# Patient Record
Sex: Female | Born: 1965 | Race: White | Hispanic: No | Smoking: Never smoker
Health system: Southern US, Community
[De-identification: ages and names within clinical notes are randomized; demographics above are authoritative.]

## PROBLEM LIST (undated history)

## (undated) DIAGNOSIS — I10 Essential (primary) hypertension: Secondary | ICD-10-CM

## (undated) DIAGNOSIS — Z8719 Personal history of other diseases of the digestive system: Secondary | ICD-10-CM

## (undated) DIAGNOSIS — T8859XA Other complications of anesthesia, initial encounter: Secondary | ICD-10-CM

## (undated) DIAGNOSIS — E041 Nontoxic single thyroid nodule: Secondary | ICD-10-CM

## (undated) DIAGNOSIS — E119 Type 2 diabetes mellitus without complications: Secondary | ICD-10-CM

## (undated) DIAGNOSIS — J45909 Unspecified asthma, uncomplicated: Secondary | ICD-10-CM

## (undated) DIAGNOSIS — I4891 Unspecified atrial fibrillation: Secondary | ICD-10-CM

## (undated) DIAGNOSIS — J189 Pneumonia, unspecified organism: Secondary | ICD-10-CM

## (undated) DIAGNOSIS — Z87442 Personal history of urinary calculi: Secondary | ICD-10-CM

## (undated) DIAGNOSIS — F419 Anxiety disorder, unspecified: Secondary | ICD-10-CM

## (undated) DIAGNOSIS — T4145XA Adverse effect of unspecified anesthetic, initial encounter: Secondary | ICD-10-CM

## (undated) DIAGNOSIS — K219 Gastro-esophageal reflux disease without esophagitis: Secondary | ICD-10-CM

## (undated) DIAGNOSIS — E7800 Pure hypercholesterolemia, unspecified: Secondary | ICD-10-CM

## (undated) HISTORY — DX: Nontoxic single thyroid nodule: E04.1

## (undated) HISTORY — DX: Gastro-esophageal reflux disease without esophagitis: K21.9

## (undated) HISTORY — DX: Pneumonia, unspecified organism: J18.9

## (undated) HISTORY — DX: Anxiety disorder, unspecified: F41.9

## (undated) HISTORY — PX: OTHER SURGICAL HISTORY: SHX169

## (undated) HISTORY — DX: Pure hypercholesterolemia, unspecified: E78.00

## (undated) HISTORY — DX: Type 2 diabetes mellitus without complications: E11.9

## (undated) HISTORY — DX: Essential (primary) hypertension: I10

## (undated) HISTORY — DX: Unspecified atrial fibrillation: I48.91

---

## 2004-01-05 ENCOUNTER — Other Ambulatory Visit: Admission: RE | Admit: 2004-01-05 | Discharge: 2004-01-05 | Payer: Self-pay | Admitting: Gynecology

## 2004-11-07 ENCOUNTER — Ambulatory Visit (HOSPITAL_COMMUNITY): Admission: RE | Admit: 2004-11-07 | Discharge: 2004-11-07 | Payer: Self-pay | Admitting: Gynecology

## 2005-01-06 ENCOUNTER — Other Ambulatory Visit: Admission: RE | Admit: 2005-01-06 | Discharge: 2005-01-06 | Payer: Self-pay | Admitting: Gynecology

## 2005-11-09 ENCOUNTER — Ambulatory Visit (HOSPITAL_COMMUNITY): Admission: RE | Admit: 2005-11-09 | Discharge: 2005-11-09 | Payer: Self-pay | Admitting: Gynecology

## 2006-01-08 ENCOUNTER — Other Ambulatory Visit: Admission: RE | Admit: 2006-01-08 | Discharge: 2006-01-08 | Payer: Self-pay | Admitting: Gynecology

## 2006-09-18 HISTORY — PX: OTHER SURGICAL HISTORY: SHX169

## 2006-11-12 ENCOUNTER — Ambulatory Visit (HOSPITAL_COMMUNITY): Admission: RE | Admit: 2006-11-12 | Discharge: 2006-11-12 | Payer: Self-pay | Admitting: Internal Medicine

## 2007-01-11 ENCOUNTER — Other Ambulatory Visit: Admission: RE | Admit: 2007-01-11 | Discharge: 2007-01-11 | Payer: Self-pay | Admitting: Gynecology

## 2007-03-02 ENCOUNTER — Inpatient Hospital Stay (HOSPITAL_COMMUNITY): Admission: EM | Admit: 2007-03-02 | Discharge: 2007-03-06 | Payer: Self-pay | Admitting: Emergency Medicine

## 2007-11-14 ENCOUNTER — Ambulatory Visit (HOSPITAL_COMMUNITY): Admission: RE | Admit: 2007-11-14 | Discharge: 2007-11-14 | Payer: Self-pay | Admitting: Gynecology

## 2008-01-14 ENCOUNTER — Other Ambulatory Visit: Admission: RE | Admit: 2008-01-14 | Discharge: 2008-01-14 | Payer: Self-pay | Admitting: Gynecology

## 2008-08-20 ENCOUNTER — Ambulatory Visit: Payer: Self-pay | Admitting: Gynecology

## 2008-11-17 ENCOUNTER — Ambulatory Visit (HOSPITAL_COMMUNITY): Admission: RE | Admit: 2008-11-17 | Discharge: 2008-11-17 | Payer: Self-pay | Admitting: Gynecology

## 2009-01-15 ENCOUNTER — Other Ambulatory Visit: Admission: RE | Admit: 2009-01-15 | Discharge: 2009-01-15 | Payer: Self-pay | Admitting: Gynecology

## 2009-01-15 ENCOUNTER — Ambulatory Visit: Payer: Self-pay | Admitting: Gynecology

## 2009-01-15 ENCOUNTER — Encounter: Payer: Self-pay | Admitting: Gynecology

## 2009-01-18 ENCOUNTER — Ambulatory Visit: Payer: Self-pay | Admitting: Gynecology

## 2009-11-10 ENCOUNTER — Encounter: Admission: RE | Admit: 2009-11-10 | Discharge: 2009-11-10 | Payer: Self-pay | Admitting: Family Medicine

## 2009-11-15 ENCOUNTER — Ambulatory Visit (HOSPITAL_COMMUNITY): Admission: RE | Admit: 2009-11-15 | Discharge: 2009-11-15 | Payer: Self-pay | Admitting: Urology

## 2009-11-24 ENCOUNTER — Ambulatory Visit (HOSPITAL_COMMUNITY): Admission: RE | Admit: 2009-11-24 | Discharge: 2009-11-24 | Payer: Self-pay | Admitting: Gynecology

## 2010-01-17 ENCOUNTER — Ambulatory Visit: Payer: Self-pay | Admitting: Gynecology

## 2010-01-17 ENCOUNTER — Other Ambulatory Visit: Admission: RE | Admit: 2010-01-17 | Discharge: 2010-01-17 | Payer: Self-pay | Admitting: Gynecology

## 2010-10-09 ENCOUNTER — Encounter: Payer: Self-pay | Admitting: Family Medicine

## 2010-12-09 ENCOUNTER — Other Ambulatory Visit: Payer: Self-pay | Admitting: Gynecology

## 2010-12-09 DIAGNOSIS — Z1231 Encounter for screening mammogram for malignant neoplasm of breast: Secondary | ICD-10-CM

## 2010-12-09 LAB — PREGNANCY, URINE: Preg Test, Ur: NEGATIVE

## 2010-12-09 LAB — GLUCOSE, CAPILLARY: Glucose-Capillary: 88 mg/dL (ref 70–99)

## 2010-12-21 ENCOUNTER — Ambulatory Visit (HOSPITAL_COMMUNITY)
Admission: RE | Admit: 2010-12-21 | Discharge: 2010-12-21 | Disposition: A | Discharge status: Home / Self Care | Payer: BC Managed Care – PPO | Source: Ambulatory Visit | Attending: Gynecology | Admitting: Gynecology

## 2010-12-21 DIAGNOSIS — Z1231 Encounter for screening mammogram for malignant neoplasm of breast: Secondary | ICD-10-CM | POA: Insufficient documentation

## 2011-01-31 NOTE — Op Note (Signed)
NAMESARAIYA, KOZMA                  ACCOUNT NO.:  [id number]   MEDICAL RECORD NO.:  [id number]          PATIENT TYPE:  INP   LOCATION:  5024                         FACILITY:  [facility]   PHYSICIAN:  Alvy Beal, MD    DATE OF BIRTH:  03/07/1966   DATE OF PROCEDURE:  DATE OF DISCHARGE:                               OPERATIVE REPORT   PREOPERATIVE DIAGNOSIS:  Right comminuted ankle fracture.   POSTOPERATIVE DIAGNOSIS:  Right comminuted ankle fracture.   PROCEDURE PERFORMED:  Open reduction and internal fixation, right ankle.   EQUIPMENT UTILIZED:  Instrumentation used was a Synthes small fragment  set, 8-hole one-third semitubular plate affixed to the fibula proximally  with three 12 mm screws, and distally with two 16 mm screw and one 18 mm  screw.   COMPLICATIONS:  None.   INTRAOPERATIVE FINDINGS:  Comminuted fracture with posterior disruption.  Able to place a single lag screw from anterior to posterior with a  decent purchase.  Anatomical reduction obtained and maintained with the  plate.  Syndesmosis stressed post plating of the fibula, and there was  no widening of the medial clear space.   HISTORY:  Kristina is a very pleasant 45 year old woman who was in her usual  state of excellent health until she fell and injured her right ankle on  Saturday.  She was admitted after a diagnosis of a complex ankle  fracture and dislocation was made.  She was closed reduced, placed into  a splint.  Today she presents to the operating room for definitive  management.   All appropriate risks, benefits and alternatives to surgery were  explained to the patient and consent was obtained.   DESCRIPTION OF PROCEDURE:  The patient was brought to the operating room  and placed supine on the operating table.  After successful induction of  general anesthesia and endotracheal intubation, a tourniquet was placed  on the right proximal thigh.  The calf and ankle and foot were prepped  and draped in  the standard fashion.  The leg was then exsanguinated with  a 6-inch Esmarch and the tourniquet inflated.  (Total tourniquet time  was 67 minutes.)   The tourniquet was properly inflated and we then made a lateral incision  over the fibula.  Sharp dissection was carried out through the  subcutaneous tissue and out to the level of the fibula.  I then  identified the fracture site and, using a #15 blade, removed the  periosteum that had become embedded into the fracture site.  Minimal  stripping was done in order to prevent compromise to the blood supply to  the fragments.  On the inferior surface there was a free butterfly  fragment noted to be present.  On the superior surface there was also a  very small free fragment.  However, there was enough that I could get a  fracture clamp around the fibula and reduce it.  Once reduced, I took an  AP, lateral and mortise views.  These showed it to be satisfactory.  The  ankle was out to length and the  medial clear space was well maintained.   At this point in time I then placed a single lag screw from anterior to  posterior.  This did obtain adequate fixation.  With the lag screw in  place, I then was able to remove the fracture clamp and had satisfactory  temporary fixation.  I then took an 8-hole one-third semitubular plate,  affixed it to the side of the fibula, and then secured it proximally and  distally with three 12 mm screws proximally; and then distally with one  16 mm bicortical screw, one 16 cancellous unicortical screw, and one 18  mm unicortical cancellous screw.  All screws had excellent purchase.  I  then irrigated the wound copiously with normal saline, and then took  final AP, lateral and mortise views.  There was satisfactory reduction  of the mortise, with no evidence of any widening.   I then took an elevator, placed it between the distal fibula and tibia,  and stressed the joint.  There was no widening noted of the medial  clear  space when this was done under fluoroscopy.  I also then took a bone  clamp, grabbed the fibula and under fluoroscopy, pulled the fibula to  ensure that there was no disruption of the syndesmosis.  With the  syndesmosis satisfactorily tested and the medial clear space well  maintained, I elected not to put a syndesmotic screw.   At this point in time with satisfactory fixation, I irrigated the wound  copiously with normal saline and closed the wound with interrupted 2-0  Vicryl sutures.  The skin was reapproximated with a running 2-0 nylon  stitch.  A dry dressing was applied, and then a Quincy Simmonds bulky  dressing was applied.  A posterior splint with side struts was then  secured with Webril to the patient.  The ankle was again properly molded  with the ankle in neutral and slight inversion.  Once the plaster  hardened, we then placed an ACE wrap over the entire thing.  The patient  tolerated the procedure well.  She was extubated and transferred to the  PACU without incident.  In the PACU she was moving all of her toes,  capillary refill was less than 2 seconds, and sensation to light touch  was intact.   At the end of the case, all needle and sponge counts were correct.   POSTOPERATIVE PLAN:  At this point in time we will plan on getting final  AP, lateral and mortise views in the morning, and will start PT/OT in  the morning.  She will eventually be discharged to home, to follow up  with me in two weeks, at which time we will remove the splint and  transition her to a short-leg cast.  She should remain with strict  nonweightbearing with crutches or a walker.      Alvy Beal, MD  Electronically Signed     DDB/MEDQ  D:  03/04/2007  T:  03/04/2007  Job:  [id number]

## 2011-01-31 NOTE — Discharge Summary (Signed)
[name], [name]                  ACCOUNT NO.:  [id number]   MEDICAL RECORD NO.:  [id number]          PATIENT TYPE:  INP   LOCATION:  5024                         FACILITY:  [facility]   PHYSICIAN:  Alvy Beal, MD    DATE OF BIRTH:  28-Aug-1966   DATE OF ADMISSION:  03/02/2007  DATE OF DISCHARGE:  03/06/2007                               DISCHARGE SUMMARY   ADMISSION DIAGNOSIS:  Right ankle fracture, dislocation.   DISCHARGE DIAGNOSIS:  Right ankle fracture, dislocation.   OPERATIVE PROCEDURE:  On March 04, 2007, was an open reduction internal  fixation of the right ankle.   HISTORY:  [name] is a very pleasant 45 year old woman who was  walking, slipped and fell and injured her right ankle.  She presented to  the emergency room with significant pain and deformity of the ankle.  X-  rays demonstrated a fracture, dislocation of the right ankle.  I was  asked to see her since I was on call.   A hematoma block was given and I reduced the ankle.  She still had  residual unstable distal fibula fracture, as well as a posterior  malleolus fracture.  After discussing options, we elected to admit the  patient for pain control and elevation.  The patient remained stable and  neurovascularly intact.  On Monday, June 16, a decision was made to take  the patient to the operating room for definitive management of the  fracture.  All appropriate risks, benefits and alternatives to surgery  were explained to the patient and consent was obtained.  She went to the  operating room that afternoon for an open reduction internal fixation.  The patient tolerated the procedure well and then was eventually  transferred back to the orthopedic floor.   Postoperative x-rays demonstrated satisfactory reduction of the ankle  fracture.   The patient was then evaluated by physiotherapy and then eventually  cleared for home discharge.  She is now ambulating with a walker,  nonweightbearing on the right lower  extremity.   She is tolerating p.o. diet and p.o. pain medications.   At this point in time, she will be discharged to home, again on strict  nonweightbearing with assistance on that right lower extremity.  She has  been given medications for her pain control, for muscle spasms and for  antiemetics.  She will also recontinue her home medications for her  diabetes.   I will have her followup with me in 2 weeks, at which time the splint  will be removed, the sutures from the wound will be taken out, Steri-  Strips will be applied, x-rays will be repeated and a cast will then be  applied.  She will continue to be strict nonweightbearing for a total of  6 weeks.     Alvy Beal, MD  Electronically Signed    DDB/MEDQ  D:  03/06/2007  T:  03/06/2007  Job:  [id number]

## 2011-01-31 NOTE — H&P (Signed)
Paula Campos, Paula Campos                  ACCOUNT NO.:  [id number]   MEDICAL RECORD NO.:  [id number]          PATIENT TYPE:  EMS   LOCATION:  MAJO                         FACILITY:  [facility]   PHYSICIAN:  Alvy Beal, MD    DATE OF BIRTH:  [birth year]   DATE OF ADMISSION:  03/02/2007  DATE OF DISCHARGE:                              HISTORY & PHYSICAL   PRIMARY CARE PHYSICIAN:  Marjory Lies, M.D.   HISTORY:  Elpidia is a very pleasant, active 45 year old woman with  underlying diabetes, well controlled with oral medication.  She was in  her usual state of good, excellent health until she fell this afternoon  at home.  She just lost her footing and fell.  She denied any loss of  consciousness, shortness of breath or chest pain or blurry vision prior  to her falling the fall.  She noted immediate pain and deformity of the  right ankle and so she was brought to the emergency room.  X-rays in the  emergency room demonstrated a fracture/dislocation of the right ankle,  and so an orthopedic consultation was requested.   PAST MEDICAL/SURGICAL/FAMILY SOCIAL HISTORY:  Diabetes.  She is a  nonsmoker.  She does not drink.  She is otherwise healthy, except for  the diabetes, which is well controlled with oral diabetic medications.   ALLERGIES:  HER ONLY DRUG ALLERGY IS AMOXICILLIN.   CURRENT MEDICATIONS:  1. Actos.  2. Altace.  3. Aspirin.  4. Singulair.   RADIOGRAPHIC DATA:  X-rays do demonstrate a fracture/dislocation with  posterior subluxation of the talus with respect to the tibia and a  proximal fibula fracture.   CLINICAL EXAM:  GENERAL:  The patient is a pleasant woman who appears  her stated age and in no acute distress.  She denies any shortness of  breath or chest pain.  ABDOMEN:  Soft, nontender.  EXTREMITIES:  She has no significant knee pain or swelling on that right  side.  She is neurologically intact, moving all of her toes.  Capillary  refill is less than 2 seconds in all  digits and she has intact  peripheral pulses.  Left lower extremity is asymptomatic.  LUNGS:  Lung fields are clear to auscultation.  HEART:  Regular rate and rhythm.   PLAN:  At this point in time, under sterile conditions a 10 mL  intraarticular lidocaine injection was administered.  I then reduced the  ankle and then placed a posterior splint with side strips.  The  reduction went uneventful.  Postreduction films at this time are  pending.  Upon the recommendation of the radiologist, I am going to  order also a CT scan, which will allow for better preoperative planning.  We will go ahead and admit her for pain control with PCA morphine as  well as frequent soft tissue checks.  After the reduction, she remained  neurovascularly intact, moving all of her toes with excellent sensation  and light touch as well as capillary refill less than 2 seconds in all  of her toes.   The plan at this  point in time is to admit for pain control.  We will  discharge her tomorrow after she is cleared by PT and we will plan on  definitive fixation in the upcoming week.  All of this was explained to  the patient.  All of her questions were addressed.      Alvy Beal, MD  Electronically Signed     DDB/MEDQ  D:  03/02/2007  T:  03/02/2007  Job:  [id number]   cc:   Marjory Lies, M.D.

## 2011-07-05 LAB — POCT I-STAT EG7
Calcium, Ion: 1.17
HCT: 36
Hemoglobin: 12.2
Operator id: 138551
pCO2, Ven: 37 — ABNORMAL LOW
pH, Ven: 7.418 — ABNORMAL HIGH
pO2, Ven: 35

## 2011-07-06 LAB — URINALYSIS, ROUTINE W REFLEX MICROSCOPIC
Bilirubin Urine: NEGATIVE
Glucose, UA: NEGATIVE
Hgb urine dipstick: NEGATIVE
Ketones, ur: NEGATIVE
Nitrite: NEGATIVE
Protein, ur: NEGATIVE
Specific Gravity, Urine: 1.022
Urobilinogen, UA: 0.2
pH: 6

## 2011-07-06 LAB — URINE CULTURE
Colony Count: 70000
Special Requests: NEGATIVE

## 2011-07-06 LAB — URINE MICROSCOPIC-ADD ON

## 2011-07-06 LAB — DIFFERENTIAL
Eosinophils Absolute: 0
Lymphs Abs: 0.8
Monocytes Relative: 3
Neutro Abs: 8.8 — ABNORMAL HIGH
Neutrophils Relative %: 89 — ABNORMAL HIGH

## 2011-07-06 LAB — CBC
MCV: 92
Platelets: 287
RBC: 4.3
WBC: 9.9

## 2011-07-06 LAB — APTT: aPTT: 26

## 2011-07-06 LAB — PROTIME-INR
INR: 1
Prothrombin Time: 13.1

## 2012-10-29 ENCOUNTER — Other Ambulatory Visit: Payer: Self-pay | Admitting: Gastroenterology

## 2012-10-29 DIAGNOSIS — R1310 Dysphagia, unspecified: Secondary | ICD-10-CM

## 2012-11-08 ENCOUNTER — Ambulatory Visit
Admission: RE | Admit: 2012-11-08 | Discharge: 2012-11-08 | Disposition: A | Discharge status: Home / Self Care | Payer: BC Managed Care – PPO | Source: Ambulatory Visit | Attending: Gastroenterology | Admitting: Gastroenterology

## 2012-11-08 DIAGNOSIS — R1310 Dysphagia, unspecified: Secondary | ICD-10-CM

## 2013-01-01 ENCOUNTER — Other Ambulatory Visit: Payer: Self-pay | Admitting: Gynecology

## 2013-01-01 DIAGNOSIS — Z1231 Encounter for screening mammogram for malignant neoplasm of breast: Secondary | ICD-10-CM

## 2013-01-08 ENCOUNTER — Ambulatory Visit (HOSPITAL_COMMUNITY)
Admission: RE | Admit: 2013-01-08 | Discharge: 2013-01-08 | Disposition: A | Discharge status: Home / Self Care | Payer: BC Managed Care – PPO | Source: Ambulatory Visit | Attending: Gynecology | Admitting: Gynecology

## 2013-01-08 DIAGNOSIS — Z1231 Encounter for screening mammogram for malignant neoplasm of breast: Secondary | ICD-10-CM | POA: Insufficient documentation

## 2014-03-25 ENCOUNTER — Other Ambulatory Visit: Payer: Self-pay | Admitting: Gynecology

## 2014-03-25 DIAGNOSIS — Z1231 Encounter for screening mammogram for malignant neoplasm of breast: Secondary | ICD-10-CM

## 2014-04-02 ENCOUNTER — Ambulatory Visit (HOSPITAL_COMMUNITY)
Admission: RE | Admit: 2014-04-02 | Discharge: 2014-04-02 | Disposition: A | Discharge status: Home / Self Care | Payer: BC Managed Care – PPO | Source: Ambulatory Visit | Attending: Gynecology | Admitting: Gynecology

## 2014-04-02 DIAGNOSIS — Z1231 Encounter for screening mammogram for malignant neoplasm of breast: Secondary | ICD-10-CM | POA: Insufficient documentation

## 2014-05-11 ENCOUNTER — Ambulatory Visit: Payer: Self-pay | Admitting: Gynecology

## 2014-05-21 ENCOUNTER — Encounter: Payer: Self-pay | Admitting: Gynecology

## 2014-05-21 ENCOUNTER — Other Ambulatory Visit (HOSPITAL_COMMUNITY)
Admission: RE | Admit: 2014-05-21 | Discharge: 2014-05-21 | Disposition: A | Discharge status: Home / Self Care | Payer: BC Managed Care – PPO | Source: Ambulatory Visit | Attending: Gynecology | Admitting: Gynecology

## 2014-05-21 ENCOUNTER — Ambulatory Visit (INDEPENDENT_AMBULATORY_CARE_PROVIDER_SITE_OTHER): Payer: BC Managed Care – PPO | Admitting: Gynecology

## 2014-05-21 VITALS — BP 140/84 | Ht 64.0 in | Wt 371.0 lb

## 2014-05-21 DIAGNOSIS — Z1151 Encounter for screening for human papillomavirus (HPV): Secondary | ICD-10-CM | POA: Diagnosis present

## 2014-05-21 DIAGNOSIS — Z01419 Encounter for gynecological examination (general) (routine) without abnormal findings: Secondary | ICD-10-CM | POA: Insufficient documentation

## 2014-05-21 DIAGNOSIS — I10 Essential (primary) hypertension: Secondary | ICD-10-CM | POA: Insufficient documentation

## 2014-05-21 DIAGNOSIS — E119 Type 2 diabetes mellitus without complications: Secondary | ICD-10-CM | POA: Insufficient documentation

## 2014-05-21 DIAGNOSIS — E7800 Pure hypercholesterolemia, unspecified: Secondary | ICD-10-CM | POA: Insufficient documentation

## 2014-05-21 DIAGNOSIS — K219 Gastro-esophageal reflux disease without esophagitis: Secondary | ICD-10-CM | POA: Insufficient documentation

## 2014-05-21 NOTE — Patient Instructions (Signed)
You may obtain a copy of any labs that were done today by logging onto MyChart as outlined in the instructions provided with your AVS (after visit summary). The office will not call with normal lab results but certainly if there are any significant abnormalities then we will contact you.   Health Maintenance, Female A healthy lifestyle and preventative care can promote health and wellness.  Maintain regular health, dental, and eye exams.  Eat a healthy diet. Foods like vegetables, fruits, whole grains, low-fat dairy products, and lean protein foods contain the nutrients you need without too many calories. Decrease your intake of foods high in solid fats, added sugars, and salt. Get information about a proper diet from your caregiver, if necessary.  Regular physical exercise is one of the most important things you can do for your health. Most adults should get at least 150 minutes of moderate-intensity exercise (any activity that increases your heart rate and causes you to sweat) each week. In addition, most adults need muscle-strengthening exercises on 2 or more days a week.   Maintain a healthy weight. The body mass index (BMI) is a screening tool to identify possible weight problems. It provides an estimate of body fat based on height and weight. Your caregiver can help determine your BMI, and can help you achieve or maintain a healthy weight. For adults 20 years and older:  A BMI below 18.5 is considered underweight.  A BMI of 18.5 to 24.9 is normal.  A BMI of 25 to 29.9 is considered overweight.  A BMI of 30 and above is considered obese.  Maintain normal blood lipids and cholesterol by exercising and minimizing your intake of saturated fat. Eat a balanced diet with plenty of fruits and vegetables. Blood tests for lipids and cholesterol should begin at age 61 and be repeated every 5 years. If your lipid or cholesterol levels are high, you are over 50, or you are a high risk for heart  disease, you may need your cholesterol levels checked more frequently.Ongoing high lipid and cholesterol levels should be treated with medicines if diet and exercise are not effective.  If you smoke, find out from your caregiver how to quit. If you do not use tobacco, do not start.  Lung cancer screening is recommended for adults aged 33 80 years who are at high risk for developing lung cancer because of a history of smoking. Yearly low-dose computed tomography (CT) is recommended for people who have at least a 30-pack-year history of smoking and are a current smoker or have quit within the past 15 years. A pack year of smoking is smoking an average of 1 pack of cigarettes a day for 1 year (for example: 1 pack a day for 30 years or 2 packs a day for 15 years). Yearly screening should continue until the smoker has stopped smoking for at least 15 years. Yearly screening should also be stopped for people who develop a health problem that would prevent them from having lung cancer treatment.  If you are pregnant, do not drink alcohol. If you are breastfeeding, be very cautious about drinking alcohol. If you are not pregnant and choose to drink alcohol, do not exceed 1 drink per day. One drink is considered to be 12 ounces (355 mL) of beer, 5 ounces (148 mL) of wine, or 1.5 ounces (44 mL) of liquor.  Avoid use of street drugs. Do not share needles with anyone. Ask for help if you need support or instructions about stopping  the use of drugs.  High blood pressure causes heart disease and increases the risk of stroke. Blood pressure should be checked at least every 1 to 2 years. Ongoing high blood pressure should be treated with medicines, if weight loss and exercise are not effective.  If you are 59 to 50 years old, ask your caregiver if you should take aspirin to prevent strokes.  Diabetes screening involves taking a blood sample to check your fasting blood sugar level. This should be done once every 3  years, after age 91, if you are within normal weight and without risk factors for diabetes. Testing should be considered at a younger age or be carried out more frequently if you are overweight and have at least 1 risk factor for diabetes.  Breast cancer screening is essential preventative care for women. You should practice "breast self-awareness." This means understanding the normal appearance and feel of your breasts and may include breast self-examination. Any changes detected, no matter how small, should be reported to a caregiver. Women in their 66s and 30s should have a clinical breast exam (CBE) by a caregiver as part of a regular health exam every 1 to 3 years. After age 101, women should have a CBE every year. Starting at age 100, women should consider having a mammogram (breast X-ray) every year. Women who have a family history of breast cancer should talk to their caregiver about genetic screening. Women at a high risk of breast cancer should talk to their caregiver about having an MRI and a mammogram every year.  Breast cancer gene (BRCA)-related cancer risk assessment is recommended for women who have family members with BRCA-related cancers. BRCA-related cancers include breast, ovarian, tubal, and peritoneal cancers. Having family members with these cancers may be associated with an increased risk for harmful changes (mutations) in the breast cancer genes BRCA1 and BRCA2. Results of the assessment will determine the need for genetic counseling and BRCA1 and BRCA2 testing.  The Pap test is a screening test for cervical cancer. Women should have a Pap test starting at age 57. Between ages 25 and 35, Pap tests should be repeated every 2 years. Beginning at age 37, you should have a Pap test every 3 years as long as the past 3 Pap tests have been normal. If you had a hysterectomy for a problem that was not cancer or a condition that could lead to cancer, then you no longer need Pap tests. If you are  between ages 50 and 76, and you have had normal Pap tests going back 10 years, you no longer need Pap tests. If you have had past treatment for cervical cancer or a condition that could lead to cancer, you need Pap tests and screening for cancer for at least 20 years after your treatment. If Pap tests have been discontinued, risk factors (such as a new sexual partner) need to be reassessed to determine if screening should be resumed. Some women have medical problems that increase the chance of getting cervical cancer. In these cases, your caregiver may recommend more frequent screening and Pap tests.  The human papillomavirus (HPV) test is an additional test that may be used for cervical cancer screening. The HPV test looks for the virus that can cause the cell changes on the cervix. The cells collected during the Pap test can be tested for HPV. The HPV test could be used to screen women aged 44 years and older, and should be used in women of any age  who have unclear Pap test results. After the age of 55, women should have HPV testing at the same frequency as a Pap test.  Colorectal cancer can be detected and often prevented. Most routine colorectal cancer screening begins at the age of 44 and continues through age 20. However, your caregiver may recommend screening at an earlier age if you have risk factors for colon cancer. On a yearly basis, your caregiver may provide home test kits to check for hidden blood in the stool. Use of a small camera at the end of a tube, to directly examine the colon (sigmoidoscopy or colonoscopy), can detect the earliest forms of colorectal cancer. Talk to your caregiver about this at age 86, when routine screening begins. Direct examination of the colon should be repeated every 5 to 10 years through age 13, unless early forms of pre-cancerous polyps or small growths are found.  Hepatitis C blood testing is recommended for all people born from [birth year] through 1967 and any  individual with known risks for hepatitis C.  Practice safe sex. Use condoms and avoid high-risk sexual practices to reduce the spread of sexually transmitted infections (STIs). Sexually active women aged 36 and younger should be checked for Chlamydia, which is a common sexually transmitted infection. Older women with new or multiple partners should also be tested for Chlamydia. Testing for other STIs is recommended if you are sexually active and at increased risk.  Osteoporosis is a disease in which the bones lose minerals and strength with aging. This can result in serious bone fractures. The risk of osteoporosis can be identified using a bone density scan. Women ages 20 and over and women at risk for fractures or osteoporosis should discuss screening with their caregivers. Ask your caregiver whether you should be taking a calcium supplement or vitamin D to reduce the rate of osteoporosis.  Menopause can be associated with physical symptoms and risks. Hormone replacement therapy is available to decrease symptoms and risks. You should talk to your caregiver about whether hormone replacement therapy is right for you.  Use sunscreen. Apply sunscreen liberally and repeatedly throughout the day. You should seek shade when your shadow is shorter than you. Protect yourself by wearing long sleeves, pants, a wide-brimmed hat, and sunglasses year round, whenever you are outdoors.  Notify your caregiver of new moles or changes in moles, especially if there is a change in shape or color. Also notify your caregiver if a mole is larger than the size of a pencil eraser.  Stay current with your immunizations. Document Released: 03/20/2011 Document Revised: 12/30/2012 Document Reviewed: 03/20/2011 [facility] Patient Information 2014 [employer].

## 2014-05-21 NOTE — Addendum Note (Signed)
Addended by: Dayna Barker on: 05/21/2014 10:40 AM   Modules accepted: Orders

## 2014-05-21 NOTE — Progress Notes (Signed)
Paula Campos 1966/02/24 [id number]        48 y.o.  G2P0011 for annual exam.  Doing well without complaints. Has not been in since 2011.  Past medical history,surgical history, problem list, medications, allergies, family history and social history were all reviewed and documented as reviewed in the EPIC chart.  ROS:  12 system ROS performed with pertinent positives and negatives included in the history, assessment and plan.   Additional significant findings :  None   Exam: Kim assistant Filed Vitals:   05/21/14 0948  BP: 140/84  Height:  (1.626 m)  Weight: 371 lb (168.284 kg)   General appearance:  Normal affect, orientation and appearance. Skin: Grossly normal HEENT: Without gross lesions.  No cervical or supraclavicular adenopathy. Thyroid normal.  Lungs:  Clear without wheezing, rales or rhonchi Cardiac: RR, without RMG Abdominal:  Soft, nontender, without masses, guarding, rebound, organomegaly or hernia Breasts:  Examined lying and sitting without masses, retractions, discharge or axillary adenopathy. Pelvic:  Ext/BUS/vagina normal  Cervix normal. Pap/HPV  Uterus difficult to palpate but grossly normal without masses or tenderness  Adnexa  Without gross masses or tenderness    Anus and perineum  Normal   Rectovaginal  Normal sphincter tone without palpated masses or tenderness.    Assessment/Plan:  48 y.o. G20P0011 female for annual exam with regular menses, vasectomy birth control.   1. Pap smear 2011. Pap/HPV today. No history of significant abnormal Pap smears previously. Plan repeat Pap smear in 3-5 year interval purpura screening guidelines. 2. Mammography 03/2014. Continue with annual mammography. SBE monthly review. 3. Health maintenance. No routine blood work done as she has this done through her primary physician's office she is following her for her medical issues. Follow up in one year, sooner as needed.   Note: This document was prepared with digital  dictation and possible smart phrase technology. Any transcriptional errors that result from this process are unintentional.   Dara Lords MD, 10:23 AM 05/21/2014

## 2014-05-22 LAB — CYTOLOGY - PAP

## 2014-07-20 ENCOUNTER — Encounter: Payer: Self-pay | Admitting: Gynecology

## 2014-12-01 ENCOUNTER — Other Ambulatory Visit: Payer: Self-pay | Admitting: Gastroenterology

## 2015-02-23 ENCOUNTER — Encounter (HOSPITAL_COMMUNITY): Payer: Self-pay | Admitting: *Deleted

## 2015-04-02 ENCOUNTER — Other Ambulatory Visit: Payer: Self-pay | Admitting: Gynecology

## 2015-04-02 DIAGNOSIS — Z1231 Encounter for screening mammogram for malignant neoplasm of breast: Secondary | ICD-10-CM

## 2015-04-08 ENCOUNTER — Ambulatory Visit (HOSPITAL_COMMUNITY)
Admission: RE | Admit: 2015-04-08 | Discharge: 2015-04-08 | Disposition: A | Discharge status: Home / Self Care | Payer: BLUE CROSS/BLUE SHIELD | Source: Ambulatory Visit | Attending: Gynecology | Admitting: Gynecology

## 2015-04-08 DIAGNOSIS — Z1231 Encounter for screening mammogram for malignant neoplasm of breast: Secondary | ICD-10-CM | POA: Insufficient documentation

## 2015-05-06 ENCOUNTER — Ambulatory Visit (HOSPITAL_COMMUNITY)
Admission: RE | Admit: 2015-05-06 | Payer: BLUE CROSS/BLUE SHIELD | Source: Ambulatory Visit | Admitting: Gastroenterology

## 2015-05-06 HISTORY — DX: Personal history of urinary calculi: Z87.442

## 2015-05-06 HISTORY — DX: Personal history of other diseases of the digestive system: Z87.19

## 2015-05-06 HISTORY — DX: Other complications of anesthesia, initial encounter: T88.59XA

## 2015-05-06 HISTORY — DX: Adverse effect of unspecified anesthetic, initial encounter: T41.45XA

## 2015-05-06 HISTORY — DX: Unspecified asthma, uncomplicated: J45.909

## 2015-05-06 SURGERY — COLONOSCOPY WITH PROPOFOL
Anesthesia: Monitor Anesthesia Care

## 2015-05-25 ENCOUNTER — Encounter: Payer: BC Managed Care – PPO | Admitting: Gynecology

## 2015-05-25 ENCOUNTER — Encounter: Payer: Self-pay | Admitting: Gynecology

## 2015-07-23 ENCOUNTER — Ambulatory Visit (INDEPENDENT_AMBULATORY_CARE_PROVIDER_SITE_OTHER): Payer: BLUE CROSS/BLUE SHIELD | Admitting: Gynecology

## 2015-07-23 ENCOUNTER — Encounter: Payer: Self-pay | Admitting: Gynecology

## 2015-07-23 VITALS — BP 134/80 | Ht 65.0 in | Wt 386.0 lb

## 2015-07-23 DIAGNOSIS — Z01419 Encounter for gynecological examination (general) (routine) without abnormal findings: Secondary | ICD-10-CM

## 2015-07-23 NOTE — Progress Notes (Signed)
Verl BlalockLeah L Gatlin [birth year] [id number]        49 y.o.  G2P0011  Patient's last menstrual period was 07/07/2015. for annual exam.  Doing well without complaints  Past medical history,surgical history, problem list, medications, allergies, family history and social history were all reviewed and documented as reviewed in the EPIC chart.  ROS:  Performed with pertinent positives and negatives included in the history, assessment and plan.   Additional significant findings :  none   Exam: [facility] personassistant Filed Vitals:   07/23/15 1527  BP: 134/80  Height: 5\' 5"  (1.651 m)  Weight: 386 lb (175.088 kg)   General appearance:  Normal affect, orientation and appearance. Skin: Grossly normal HEENT: Without gross lesions.  No cervical or supraclavicular adenopathy. Thyroid normal.  Lungs:  Clear without wheezing, rales or rhonchi Cardiac: RR, without RMG Abdominal:  Soft, nontender, without masses, guarding, rebound, organomegaly or hernia Breasts:  Examined lying and sitting without masses, retractions, discharge or axillary adenopathy. Pelvic:  Ext/BUS/vagina normal  Cervix normal  Uterus unable to palpate due to abdominal girth but no gross masses or tenderness  Adnexa  Without gross masses or tenderness    Anus and perineum  Normal   Rectovaginal  Normal sphincter tone without palpated masses or tenderness.    Assessment/Plan:  49 y.o. 652P0011 female for annual exam with regular menses, vasectomy birth control. No signs or symptoms to suggest menopausal changes.  1. Regular menses. Age 49. Reviewed signs and symptoms of menopause. Patient will call if she has prolonged or atypical bleeding. As long as regular menses or less frequent but regular menses and will follow at present. 2. Pap smear/HPV 2015 negative. No Pap smear done today.  No history of abnormal Pap smears. 3. Mammography 03/2015. Continue with annual mammography when due. SBE monthly reviewed. 4. Colonoscopy never. She had a  schedule but had to cancel. She knows to go ahead and call and  Reschedule and she agrees to do so. 5. Health maintenance. No routine lab work done as this is done through her primary physician's office. Follow up 1 year, sooner as needed.   Dara LordsFONTAINE,Glorianne Proctor P MD, 4:01 PM 07/23/2015

## 2015-07-23 NOTE — Patient Instructions (Signed)

## 2015-11-04 ENCOUNTER — Other Ambulatory Visit: Payer: Self-pay | Admitting: Gastroenterology

## 2015-12-01 ENCOUNTER — Encounter (HOSPITAL_COMMUNITY): Payer: Self-pay | Admitting: *Deleted

## 2015-12-07 ENCOUNTER — Encounter (HOSPITAL_COMMUNITY): Payer: Self-pay

## 2015-12-07 ENCOUNTER — Encounter (HOSPITAL_COMMUNITY)
Admission: RE | Disposition: A | Discharge status: Home / Self Care | Payer: Self-pay | Source: Ambulatory Visit | Attending: Gastroenterology

## 2015-12-07 ENCOUNTER — Ambulatory Visit (HOSPITAL_COMMUNITY): Payer: BLUE CROSS/BLUE SHIELD | Admitting: Certified Registered Nurse Anesthetist

## 2015-12-07 ENCOUNTER — Ambulatory Visit (HOSPITAL_COMMUNITY)
Admission: RE | Admit: 2015-12-07 | Discharge: 2015-12-07 | Disposition: A | Discharge status: Home / Self Care | Payer: BLUE CROSS/BLUE SHIELD | Source: Ambulatory Visit | Attending: Gastroenterology | Admitting: Gastroenterology

## 2015-12-07 DIAGNOSIS — Z6841 Body Mass Index (BMI) 40.0 and over, adult: Secondary | ICD-10-CM | POA: Diagnosis not present

## 2015-12-07 DIAGNOSIS — Z79899 Other long term (current) drug therapy: Secondary | ICD-10-CM | POA: Diagnosis not present

## 2015-12-07 DIAGNOSIS — K449 Diaphragmatic hernia without obstruction or gangrene: Secondary | ICD-10-CM | POA: Insufficient documentation

## 2015-12-07 DIAGNOSIS — E7800 Pure hypercholesterolemia, unspecified: Secondary | ICD-10-CM | POA: Diagnosis not present

## 2015-12-07 DIAGNOSIS — I10 Essential (primary) hypertension: Secondary | ICD-10-CM | POA: Diagnosis not present

## 2015-12-07 DIAGNOSIS — J45909 Unspecified asthma, uncomplicated: Secondary | ICD-10-CM | POA: Diagnosis not present

## 2015-12-07 DIAGNOSIS — E119 Type 2 diabetes mellitus without complications: Secondary | ICD-10-CM | POA: Insufficient documentation

## 2015-12-07 DIAGNOSIS — Z7984 Long term (current) use of oral hypoglycemic drugs: Secondary | ICD-10-CM | POA: Insufficient documentation

## 2015-12-07 DIAGNOSIS — F419 Anxiety disorder, unspecified: Secondary | ICD-10-CM | POA: Insufficient documentation

## 2015-12-07 DIAGNOSIS — K5730 Diverticulosis of large intestine without perforation or abscess without bleeding: Secondary | ICD-10-CM | POA: Insufficient documentation

## 2015-12-07 DIAGNOSIS — Z1211 Encounter for screening for malignant neoplasm of colon: Secondary | ICD-10-CM | POA: Diagnosis present

## 2015-12-07 DIAGNOSIS — K219 Gastro-esophageal reflux disease without esophagitis: Secondary | ICD-10-CM | POA: Diagnosis not present

## 2015-12-07 HISTORY — PX: COLONOSCOPY WITH PROPOFOL: SHX5780

## 2015-12-07 LAB — GLUCOSE, CAPILLARY: GLUCOSE-CAPILLARY: 98 mg/dL (ref 65–99)

## 2015-12-07 SURGERY — COLONOSCOPY WITH PROPOFOL
Anesthesia: Monitor Anesthesia Care

## 2015-12-07 MED ORDER — PROPOFOL 500 MG/50ML IV EMUL
INTRAVENOUS | Status: DC | PRN
  Administered 2015-12-07: 75 ug/kg/min via INTRAVENOUS

## 2015-12-07 MED ORDER — SODIUM CHLORIDE 0.9 % IV SOLN: INTRAVENOUS | Status: DC

## 2015-12-07 MED ORDER — SODIUM CHLORIDE 0.9 % IJ SOLN
INTRAMUSCULAR | Status: AC
  Filled 2015-12-07: qty 10

## 2015-12-07 MED ORDER — LIDOCAINE HCL (CARDIAC) 20 MG/ML IV SOLN
INTRAVENOUS | Status: AC
  Filled 2015-12-07: qty 5

## 2015-12-07 MED ORDER — PROPOFOL 10 MG/ML IV BOLUS
INTRAVENOUS | Status: DC | PRN
  Administered 2015-12-07: 20 mg via INTRAVENOUS
  Administered 2015-12-07: 30 mg via INTRAVENOUS
  Administered 2015-12-07 (×2): 50 mg via INTRAVENOUS

## 2015-12-07 MED ORDER — LIDOCAINE HCL (CARDIAC) 20 MG/ML IV SOLN
INTRAVENOUS | Status: DC | PRN
  Administered 2015-12-07: 50 mg via INTRAVENOUS

## 2015-12-07 MED ORDER — LACTATED RINGERS IV SOLN
INTRAVENOUS | Status: DC
  Administered 2015-12-07: 07:00:00 via INTRAVENOUS

## 2015-12-07 MED ORDER — PROPOFOL 10 MG/ML IV BOLUS
INTRAVENOUS | Status: AC
  Filled 2015-12-07: qty 60

## 2015-12-07 MED ORDER — EPHEDRINE SULFATE 50 MG/ML IJ SOLN
INTRAMUSCULAR | Status: AC
  Filled 2015-12-07: qty 1

## 2015-12-07 SURGICAL SUPPLY — 22 items

## 2015-12-07 NOTE — Anesthesia Postprocedure Evaluation (Signed)
Anesthesia Post Note  Patient: Paula Campos  Procedure(s) Performed: Procedure(s) (LRB): COLONOSCOPY WITH PROPOFOL (N/A)  Patient location during evaluation: PACU Anesthesia Type: MAC Level of consciousness: awake and alert Pain management: pain level controlled Vital Signs Assessment: post-procedure vital signs reviewed and stable Respiratory status: spontaneous breathing, nonlabored ventilation, respiratory function stable and patient connected to nasal cannula oxygen Cardiovascular status: stable and blood pressure returned to baseline Anesthetic complications: no    Last Vitals:  Filed Vitals:   12/07/15 0800 12/07/15 0810  BP: 142/64 117/59  Pulse: 66 69  Temp:    Resp: 16 24    Last Pain: There were no vitals filed for this visit.               Shelton SilvasKevin D Bernestine Holsapple

## 2015-12-07 NOTE — Anesthesia Preprocedure Evaluation (Addendum)
Anesthesia Evaluation  Patient identified by MRN, date of birth, ID band Patient awake    Reviewed: Allergy & Precautions, NPO status , Patient's Chart, lab work & pertinent test results  Airway Mallampati: I  TM Distance: >3 FB Neck ROM: Full    Dental  (+) Teeth Intact   Pulmonary asthma ,    breath sounds clear to auscultation       Cardiovascular hypertension, Pt. on medications  Rhythm:Regular Rate:Normal     Neuro/Psych PSYCHIATRIC DISORDERS Anxiety    GI/Hepatic Neg liver ROS, hiatal hernia, GERD  Medicated,  Endo/Other  diabetes  Renal/GU negative Renal ROS  negative genitourinary   Musculoskeletal negative musculoskeletal ROS (+)   Abdominal   Peds negative pediatric ROS (+)  Hematology negative hematology ROS (+)   Anesthesia Other Findings   Reproductive/Obstetrics negative OB ROS                            Lab Results  Component Value Date   WBC 9.9 03/02/2007   HGB 12.2 03/04/2007   HCT 36.0 03/04/2007   MCV 92.0 03/02/2007   PLT 287 03/02/2007   Lab Results  Component Value Date   NA 139 03/04/2007   K 3.4* 03/04/2007   Lab Results  Component Value Date   INR 1.0 03/02/2007     Anesthesia Physical Anesthesia Plan  ASA: IV  Anesthesia Plan: MAC   Post-op Pain Management:    Induction: Intravenous  Airway Management Planned: Natural Airway and Simple Face Mask  Additional Equipment:   Intra-op Plan:   Post-operative Plan:   Informed Consent: I have reviewed the patients History and Physical, chart, labs and discussed the procedure including the risks, benefits and alternatives for the proposed anesthesia with the patient or authorized representative who has indicated his/her understanding and acceptance.   Dental advisory given  Plan Discussed with: CRNA  Anesthesia Plan Comments:         Anesthesia Quick Evaluation

## 2015-12-07 NOTE — Discharge Instructions (Signed)
Colonoscopy, Care After °Refer to this sheet in the next few weeks. These instructions provide you with information on caring for yourself after your procedure. Your health care provider may also give you more specific instructions. Your treatment has been planned according to current medical practices, but problems sometimes occur. Call your health care provider if you have any problems or questions after your procedure. °WHAT TO EXPECT AFTER THE PROCEDURE  °After your procedure, it is typical to have the following: °· A small amount of blood in your stool. °· Moderate amounts of gas and mild abdominal cramping or bloating. °HOME CARE INSTRUCTIONS °· Do not drive, operate machinery, or sign important documents for 24 hours. °· You may shower and resume your regular physical activities, but move at a slower pace for the first 24 hours. °· Take frequent rest periods for the first 24 hours. °· Walk around or put a warm pack on your abdomen to help reduce abdominal cramping and bloating. °· Drink enough fluids to keep your urine clear or pale yellow. °· You may resume your normal diet as instructed by your health care provider. Avoid heavy or fried foods that are hard to digest. °· Avoid drinking alcohol for 24 hours or as instructed by your health care provider. °· Only take over-the-counter or prescription medicines as directed by your health care provider. °· If a tissue sample (biopsy) was taken during your procedure: °¨ Do not take aspirin or blood thinners for 7 days, or as instructed by your health care provider. °¨ Do not drink alcohol for 7 days, or as instructed by your health care provider. °¨ Eat soft foods for the first 24 hours. °SEEK MEDICAL CARE IF: °You have persistent spotting of blood in your stool 2-3 days after the procedure. °SEEK IMMEDIATE MEDICAL CARE IF: °· You have more than a small spotting of blood in your stool. °· You pass large blood clots in your stool. °· Your abdomen is swollen  (distended). °· You have nausea or vomiting. °· You have a fever. °· You have increasing abdominal pain that is not relieved with medicine. °  °This information is not intended to replace advice given to you by your health care provider. Make sure you discuss any questions you have with your health care provider. °  °Document Released: 04/18/2004 Document Revised: 06/25/2013 Document Reviewed: 05/12/2013 °Elsevier Interactive Patient Education ©2016 [employer] ° °

## 2015-12-07 NOTE — Addendum Note (Signed)
Addendum  created 12/07/15 0910 by Ludwig Leanachel C Clista Rainford, CRNA   Modules edited: Anesthesia Medication Administration

## 2015-12-07 NOTE — Transfer of Care (Signed)
Immediate Anesthesia Transfer of Care Note  Patient: Paula Campos  Procedure(s) Performed: Procedure(s): COLONOSCOPY WITH PROPOFOL (N/A)  Patient Location: PACU  Anesthesia Type:MAC  Level of Consciousness: Patient easily awoken, sedated, comfortable, cooperative, following commands, responds to stimulation.   Airway & Oxygen Therapy: Patient spontaneously breathing, ventilating well, oxygen via simple oxygen mask.  Post-op Assessment: Report given to PACU RN, vital signs reviewed and stable, moving all extremities.   Post vital signs: Reviewed and stable.  Complications: No apparent anesthesia complications

## 2015-12-07 NOTE — H&P (Signed)
Paula BlalockLeah L Campos is an 50 y.o. female.   Chief Complaint: Colorectal cancer screening. HPI:50 year old, morbidly obese white female here for a screening colonoscopy. She has a BMI of 64. See office notes for details.   Past Medical History  Diagnosis Date  . Elevated cholesterol   . GERD (gastroesophageal reflux disease)   . Hypertension   . Diabetes mellitus without complication (HCC)     type 2  . History of kidney stones   . History of hiatal hernia   . Complication of anesthesia     slow  for epidural to wear off after c section  . Anxiety   . Asthma     Broncitis-"Bronchial asthma"-only uses singulair-no Inhalers required.   Past Surgical History  Procedure Laterality Date  . Cesarean section  24 1/2 yrs ago    x 1  . Broken ankle Right 2008  . Lithotrispy  2011 or 2012   Family History  Problem Relation Age of Onset  . Cancer ?uterine  Maternal Grandmother   . MI Maternal Grandfather    Social History:  reports that she has never smoked. She has never used smokeless tobacco. She reports that she drinks alcohol. She reports that she does not use illicit drugs.  Allergies:  Allergies  Allergen Reactions  . Sulfa Antibiotics Dermatitis  . Adhesive [Tape] Rash  . Amoxicillin Rash    Has patient had a PCN reaction causing immediate rash, facial/tongue/throat swelling, SOB or lightheadedness with hypotension: No Has patient had a PCN reaction causing severe rash involving mucus membranes or skin necrosis: No Has patient had a PCN reaction that required hospitalization No Has patient had a PCN reaction occurring within the last 10 years: No If all of the above answers are "NO", then may proceed with Cephalosporin use.   Medications Prior to Admission  Medication Sig Dispense Refill  . ALPRAZolam (XANAX) 0.5 MG tablet Take 0.5 mg by mouth at bedtime as needed for anxiety.    . Ascorbic Acid (VITAMIN C) 1000 MG tablet Take 1,000 mg by mouth daily.    Marland Kitchen. atorvastatin (LIPITOR)  40 MG tablet Take 40 mg by mouth daily.  1  . Calcium Carbonate-Vitamin D (CALCIUM + D PO) Take 2 tablets by mouth daily.     Burman Blacksmith. EQ FIBER SUPPLEMENT PO Take 2 tablets by mouth daily.    . montelukast (SINGULAIR) 10 MG tablet Take 10 mg by mouth at bedtime.    . Multiple Vitamin (MULTIVITAMIN) tablet Take 1 tablet by mouth daily.    Marland Kitchen. omeprazole (PRILOSEC) 40 MG capsule Take 40 mg by mouth daily.    . pioglitazone (ACTOS) 15 MG tablet Take 15 mg by mouth daily.    . ramipril (ALTACE) 10 MG capsule Take 10 mg by mouth daily.     Review of Systems  Constitutional: Negative.   HENT: Negative.   Eyes: Negative.   Respiratory: Negative.   Cardiovascular: Negative.   Gastrointestinal: Positive for heartburn.  Genitourinary: Negative.   Musculoskeletal: Negative.   Skin: Negative.   Neurological: Negative.   Endo/Heme/Allergies: Negative.   Psychiatric/Behavioral: Negative.    Blood pressure 145/85, pulse 77, temperature 98.9 F (37.2 C), temperature source Oral, resp. rate 13, height 5\' 5"  (1.651 m), weight 175.088 kg (386 lb), last menstrual period 11/24/2015, SpO2 97 %. Physical Exam  Constitutional: She is oriented to person, place, and time. She appears well-developed and well-nourished.  Morbidly obese  HENT:  Head: Normocephalic and atraumatic.  Eyes:  Conjunctivae and EOM are normal. Pupils are equal, round, and reactive to light.  Neck: Normal range of motion. Neck supple.  Cardiovascular: Normal rate and regular rhythm.   GI: Soft. Bowel sounds are normal.  Musculoskeletal: Normal range of motion.  Neurological: She is alert and oriented to person, place, and time.  Skin: Skin is warm and dry.  Psychiatric: She has a normal mood and affect. Her behavior is normal. Judgment and thought content normal.   Assessment/Plan Colorectal cancer screening: proceed with a colonoscopy at this time.  Paula Kitzmiller, MD 12/07/2015, 7:02 AM

## 2015-12-07 NOTE — Op Note (Signed)
[facility] Patient Name: Paula Campos Procedure Date: 12/07/2015 MRN: [id number] Attending MD: Charna Elizabeth , MD Date of Birth: 1966/01/01 CSN:  Age: 50 Admit Type: Outpatient Procedure:                Screening colonoscopy. Indications:              CRC screening for colorectal malignant neoplasm. Providers:                Charna Elizabeth, MD, Dwain Sarna, RN, Darletta Moll,                            technician, Clearnce Sorrel, technician, Randon Goldsmith,                            CRNA. Referring MD:             Delorse Lek, MD, Colin Broach, MD Medicines:                Monitored Anesthesia Care Complications:            No immediate complications. Estimated Blood Loss:     Estimated blood loss: none. Procedure:                Pre-anesthesia assessment: - Prior to the                            procedure, a history and physical was performed,                            and patient medications and allergies were                            reviewed. The patient's tolerance of previous                            anesthesia was also reviewed. The risks and                            benefits of the procedure and the sedation options                            and risks were discussed with the patient. All                            questions were answered, and informed consent was                            obtained. Prior anticoagulants: The patient has                            taken no previous anticoagulant or antiplatelet                            agents. ASA Grade Assessment: III - A patient with  severe systemic disease. After reviewing the risks                            and benefits, the patient was deemed in                            satisfactory condition to undergo the procedure.                            After obtaining informed consent, the colonoscope                            was passed under direct vision. Throughout the                             procedure, the patient's blood pressure, pulse, and                            oxygen saturations were monitored continuously. The                            EC-[id number] ([id number]) scope was introduced through                            the anus and advanced to the the terminal ileum,                            with identification of the appendiceal orifice and                            IC valve. The colonoscopy was performed without                            difficulty. The patient tolerated the procedure                            well. The quality of the bowel preparation was                            adequate. The terminal ileum, the ileocecal valve,                            the appendiceal orifice and the rectum were                            photographed. The bowel preparation used was                            GoLYTELY. Scope In: 7:33:04 AM Scope Out: 7:43:49 AM Scope Withdrawal Time: 0 hours 7 minutes 29 seconds  Total Procedure Duration: 0 hours 10 minutes 45 seconds  Findings:      Scattered small-mouthed diverticula were found in the entire colon.      The terminal ileum appeared normal.  No additional abnormalities were found on retroflexion. Impression:               - Diverticulosis in the entire examined colon.                           - The examined portion of the ileum was normal.                           - No specimens collected. Moderate Sedation:      N/A- Per Anesthesia Care Recommendation:           - High fiber, low fat diet with augmented water                            consumption daily.                           - Continue present medications.                           - Repeat colonoscopy in 10 years for surveillance.                           - Return to GI office PRN.                           - If the patient has any abnormal GI symptoms in                            the interim, she has been advised to call the                             office ASAP for further recommendations. Procedure Code(s):        --- Professional ---                           [phone], Colonoscopy, flexible; diagnostic, including                            collection of specimen(s) by brushing or washing,                            when performed (separate procedure) Diagnosis Code(s):        --- Professional ---                           Z12.11, Encounter for screening for malignant                            neoplasm of colon                           K57.30, Diverticulosis of large intestine without                            perforation or abscess without  bleeding CPT copyright 2016 [employer] Medical Association. All rights reserved. The codes documented in this report are preliminary and upon coder review may  be revised to meet current compliance requirements. Charna ElizabethJyothi Merrick Maggio, MD Charna ElizabethJyothi Lindell Tussey, MD 12/07/2015 7:50:39 AM This report has been signed electronically. Number of Addenda: 0

## 2016-02-01 DIAGNOSIS — M26629 Arthralgia of temporomandibular joint, unspecified side: Secondary | ICD-10-CM | POA: Insufficient documentation

## 2016-05-26 DIAGNOSIS — F411 Generalized anxiety disorder: Secondary | ICD-10-CM | POA: Insufficient documentation

## 2016-07-11 ENCOUNTER — Other Ambulatory Visit: Payer: Self-pay | Admitting: Gynecology

## 2016-07-11 DIAGNOSIS — Z1231 Encounter for screening mammogram for malignant neoplasm of breast: Secondary | ICD-10-CM

## 2016-07-27 ENCOUNTER — Ambulatory Visit
Admission: RE | Admit: 2016-07-27 | Discharge: 2016-07-27 | Disposition: A | Discharge status: Home / Self Care | Payer: BLUE CROSS/BLUE SHIELD | Source: Ambulatory Visit | Attending: Gynecology | Admitting: Gynecology

## 2016-07-27 DIAGNOSIS — Z1231 Encounter for screening mammogram for malignant neoplasm of breast: Secondary | ICD-10-CM | POA: Insufficient documentation

## 2016-07-28 ENCOUNTER — Encounter: Payer: Self-pay | Admitting: Gynecology

## 2016-07-28 ENCOUNTER — Ambulatory Visit (INDEPENDENT_AMBULATORY_CARE_PROVIDER_SITE_OTHER): Payer: BLUE CROSS/BLUE SHIELD | Admitting: Gynecology

## 2016-07-28 VITALS — BP 132/80 | Ht 65.0 in | Wt 394.0 lb

## 2016-07-28 DIAGNOSIS — N926 Irregular menstruation, unspecified: Secondary | ICD-10-CM

## 2016-07-28 DIAGNOSIS — Z01419 Encounter for gynecological examination (general) (routine) without abnormal findings: Secondary | ICD-10-CM

## 2016-07-28 NOTE — Progress Notes (Signed)
    Paula BlalockLeah L Campos 21-Mar-1966 [id number]        50 y.o.  G2P0011  for annual exam.  Doing well. Several issues noted below.  Past medical history,surgical history, problem list, medications, allergies, family history and social history were all reviewed and documented as reviewed in the EPIC chart.  ROS:  Performed with pertinent positives and negatives included in the history, assessment and plan.   Additional significant findings :  None   Exam: Bari MantisKim Alexis assistant Vitals:   07/28/16 1525  BP: 132/80  Weight: (!) 394 lb (178.7 kg)  Height: 5\' 5"  (1.651 m)   Body mass index is 65.57 kg/m.  General appearance:  Normal affect, orientation and appearance. Skin: Grossly normal HEENT: Without gross lesions.  No cervical or supraclavicular adenopathy. Thyroid normal.  Lungs:  Clear without wheezing, rales or rhonchi Cardiac: RR, without RMG Abdominal:  Soft, nontender, without masses, guarding, rebound, organomegaly or hernia Breasts:  Examined lying and sitting without masses, retractions, discharge or axillary adenopathy. Pelvic:  Ext, BUS, Vagina normal  Cervix normal  Uterus unable to palpate. No gross masses or tenderness  Adnexa without gross masses or tenderness    Anus and perineum normal   Rectovaginal normal sphincter tone without palpated masses or tenderness.    Assessment/Plan:  50 y.o. 202P0011 female for annual exam.   1. Menstrual regularity. Patient's last menstrual period was in September. Is starting to space out her menses. Occasional hot flushes. Will monitor for now. As long as less frequent but regular menses will follow. Prolonged or atypical bleeding or develops menopausal symptoms are unacceptable she'll follow up for further evaluation. 2. Pap smear/HPV 2015 negative. No Pap smear done today. No history of significant abnormal Pap smears. 3. Mammography 07/2016. Continue with annual mammography when due. SBE monthly reviewed. 4. Colonoscopy 11/2015.  Repeat at their recommended interval. 5. Health maintenance. No routine lab work done as patient reports this done elsewhere. Follow up 1 year, sooner as needed.   Dara LordsFONTAINE,Paula Walder P MD, 3:53 PM 07/28/2016

## 2016-07-28 NOTE — Patient Instructions (Signed)

## 2016-11-24 DIAGNOSIS — Z9109 Other allergy status, other than to drugs and biological substances: Secondary | ICD-10-CM | POA: Insufficient documentation

## 2017-06-23 ENCOUNTER — Emergency Department (HOSPITAL_COMMUNITY)
Admission: EM | Admit: 2017-06-23 | Discharge: 2017-06-24 | Disposition: A | Discharge status: Home / Self Care | Payer: BLUE CROSS/BLUE SHIELD | Attending: Emergency Medicine | Admitting: Emergency Medicine

## 2017-06-23 ENCOUNTER — Emergency Department (HOSPITAL_COMMUNITY): Payer: BLUE CROSS/BLUE SHIELD

## 2017-06-23 ENCOUNTER — Encounter (HOSPITAL_COMMUNITY): Payer: Self-pay | Admitting: Nurse Practitioner

## 2017-06-23 DIAGNOSIS — J690 Pneumonitis due to inhalation of food and vomit: Secondary | ICD-10-CM | POA: Insufficient documentation

## 2017-06-23 DIAGNOSIS — K219 Gastro-esophageal reflux disease without esophagitis: Secondary | ICD-10-CM | POA: Insufficient documentation

## 2017-06-23 DIAGNOSIS — E119 Type 2 diabetes mellitus without complications: Secondary | ICD-10-CM | POA: Insufficient documentation

## 2017-06-23 DIAGNOSIS — I10 Essential (primary) hypertension: Secondary | ICD-10-CM | POA: Diagnosis not present

## 2017-06-23 DIAGNOSIS — R112 Nausea with vomiting, unspecified: Secondary | ICD-10-CM | POA: Diagnosis present

## 2017-06-23 DIAGNOSIS — Z79899 Other long term (current) drug therapy: Secondary | ICD-10-CM | POA: Diagnosis not present

## 2017-06-23 DIAGNOSIS — Z7984 Long term (current) use of oral hypoglycemic drugs: Secondary | ICD-10-CM | POA: Insufficient documentation

## 2017-06-23 LAB — COMPREHENSIVE METABOLIC PANEL
ALBUMIN: 3.8 g/dL (ref 3.5–5.0)
ALK PHOS: 69 U/L (ref 38–126)
ALT: 19 U/L (ref 14–54)
ANION GAP: 11 (ref 5–15)
AST: 20 U/L (ref 15–41)
BUN: 15 mg/dL (ref 6–20)
CALCIUM: 9.4 mg/dL (ref 8.9–10.3)
CHLORIDE: 104 mmol/L (ref 101–111)
CO2: 26 mmol/L (ref 22–32)
Creatinine, Ser: 0.83 mg/dL (ref 0.44–1.00)
GFR calc Af Amer: >60 mL/min (ref >60)
GFR calc non Af Amer: >60 mL/min (ref >60)
GLUCOSE: 142 mg/dL — AB (ref 65–99)
Potassium: 3.8 mmol/L (ref 3.5–5.1)
SODIUM: 141 mmol/L (ref 135–145)
Total Bilirubin: 1 mg/dL (ref 0.3–1.2)
Total Protein: 7.6 g/dL (ref 6.5–8.1)

## 2017-06-23 LAB — CBC
HEMATOCRIT: 39.6 % (ref 36.0–46.0)
HEMOGLOBIN: 13.1 g/dL (ref 12.0–15.0)
MCH: 30.5 pg (ref 26.0–34.0)
MCHC: 33.1 g/dL (ref 30.0–36.0)
MCV: 92.1 fL (ref 78.0–100.0)
Platelets: 250 10*3/uL (ref 150–400)
RBC: 4.3 MIL/uL (ref 3.87–5.11)
RDW: 13.7 % (ref 11.5–15.5)
WBC: 21.8 10*3/uL — ABNORMAL HIGH (ref 4.0–10.5)

## 2017-06-23 LAB — I-STAT CG4 LACTIC ACID, ED: LACTIC ACID, VENOUS: 2.07 mmol/L — AB (ref 0.5–1.9)

## 2017-06-23 LAB — LIPASE, BLOOD: LIPASE: 18 U/L (ref 11–51)

## 2017-06-23 MED ORDER — CEPHALEXIN 500 MG PO CAPS: 500.0000 mg | ORAL_CAPSULE | Freq: Three times a day (TID) | ORAL | 0 refills | Status: DC

## 2017-06-23 MED ORDER — DEXTROSE 5 % IV SOLN
1.0000 g | Freq: Once | INTRAVENOUS | Status: AC
  Administered 2017-06-23: 1 g via INTRAVENOUS
  Filled 2017-06-23: qty 10

## 2017-06-23 MED ORDER — FAMOTIDINE 20 MG PO TABS: 20.0000 mg | ORAL_TABLET | Freq: Two times a day (BID) | ORAL | 0 refills | Status: DC

## 2017-06-23 MED ORDER — AZITHROMYCIN 250 MG PO TABS
500.0000 mg | ORAL_TABLET | Freq: Once | ORAL | Status: AC
  Administered 2017-06-24: 500 mg via ORAL
  Filled 2017-06-23: qty 2

## 2017-06-23 MED ORDER — FAMOTIDINE IN NACL 20-0.9 MG/50ML-% IV SOLN
20.0000 mg | Freq: Once | INTRAVENOUS | Status: AC
  Administered 2017-06-23: 20 mg via INTRAVENOUS
  Filled 2017-06-23: qty 50

## 2017-06-23 MED ORDER — IOPAMIDOL (ISOVUE-370) INJECTION 76%
INTRAVENOUS | Status: AC
  Administered 2017-06-23: 100 mL via INTRAVENOUS
  Filled 2017-06-23: qty 100

## 2017-06-23 MED ORDER — AZITHROMYCIN 250 MG PO TABS: ORAL_TABLET | ORAL | 0 refills | Status: DC

## 2017-06-23 MED ORDER — ONDANSETRON HCL 4 MG/2ML IJ SOLN
4.0000 mg | Freq: Once | INTRAMUSCULAR | Status: AC
  Administered 2017-06-23: 4 mg via INTRAVENOUS
  Filled 2017-06-23: qty 2

## 2017-06-23 MED ORDER — DEXTROSE 5 % IV SOLN
500.0000 mg | Freq: Once | INTRAVENOUS | Status: DC
  Filled 2017-06-23: qty 500

## 2017-06-23 MED ORDER — SODIUM CHLORIDE 0.9 % IV BOLUS (SEPSIS)
1000.0000 mL | Freq: Once | INTRAVENOUS | Status: AC
  Administered 2017-06-23: 1000 mL via INTRAVENOUS

## 2017-06-23 MED ORDER — SODIUM CHLORIDE 0.9 % IV SOLN
80.0000 mg | Freq: Once | INTRAVENOUS | Status: AC
  Administered 2017-06-23: 80 mg via INTRAVENOUS
  Filled 2017-06-23: qty 80

## 2017-06-23 NOTE — ED Provider Notes (Signed)
[facility]-EMERGENCY DEPT Provider Note   CSN: [id number] Arrival date & time: 06/23/17  1648     History   Chief Complaint Chief Complaint  Patient presents with  . N/V/D  . Hemoptysis    HPI Paula Campos is a 51 y.o. female history of asthma, bronchitis, diabetes, reflux here presenting with worsening reflux, cough, blood tinged sputum, chills. Patient states that she ate some pizza which she was not supposed to yesterday. She then laid down and fell asleep and noticed acid reflux in the center of her chest. She woke up this morning and noticed some chills and some productive cough and had 3 bloody sputum's as well. She then fell asleep again and woke around 4 PM and still has acid taste in her mouth but denies any further cough or bloody sputum and came to the ER for evaluation. She denies any abdominal pain or diarrhea or urinary symptoms. Denies any chest pain or shortness of breath.  The history is provided by the patient.    Past Medical History:  Diagnosis Date  . Anxiety   . Asthma    Broncitis-"Bronchial asthma"-only uses singulair-no Inhalers required.  . Complication of anesthesia    slow  for epidural to wear off after c section  . Diabetes mellitus without complication (HCC)    type 2  . Elevated cholesterol   . GERD (gastroesophageal reflux disease)   . History of hiatal hernia   . History of kidney stones   . Hypertension     Patient Active Problem List   Diagnosis Date Noted  . Elevated cholesterol   . Diabetes mellitus without complication (HCC)   . GERD (gastroesophageal reflux disease)   . Hypertension     Past Surgical History:  Procedure Laterality Date  . Broken ankle Right 2008  . CESAREAN SECTION  24 1/2 yrs ago   x 1  . COLONOSCOPY WITH PROPOFOL N/A 12/07/2015   Procedure: COLONOSCOPY WITH PROPOFOL;  Surgeon: Charna Elizabeth, MD;  Location: [facility] ENDOS[facility];  Service: Endoscopy;  Laterality: N/A;  . lithotrispy  2011 or 2012    OB History    Gravida  Para Term Preterm AB Living   SAB TAB Ectopic Multiple Live Births   1               Home Medications    Prior to Admission medications   Medication Sig Start Date End Date Taking? Authorizing Provider  albuterol (PROVENTIL HFA;VENTOLIN HFA) 108 (90 Base) MCG/ACT inhaler Inhale 1 puff into the lungs daily as needed for shortness of breath. 03/02/17  Yes [provider]  ALPRAZolam Prudy Feeler) 0.5 MG tablet Take 0.5 mg by mouth at bedtime as needed for anxiety.   Yes [provider]  Ascorbic Acid (VITAMIN C) 1000 MG tablet Take 1,000 mg by mouth daily.   Yes [provider]  atorvastatin (LIPITOR) 40 MG tablet Take 40 mg by mouth daily. 10/11/15  Yes [provider]  Calcium Carbonate-Vitamin D (CALCIUM + D PO) Take 2 tablets by mouth daily.    Yes [provider]  EQ FIBER SUPPLEMENT PO Take 2 tablets by mouth daily.   Yes [provider]  fluticasone (FLONASE) 50 MCG/ACT nasal spray Place 1 spray into both nostrils daily as needed for allergies. 05/25/17  Yes [provider]  montelukast (SINGULAIR) 10 MG tablet Take 10 mg by mouth at bedtime.   Yes [provider]  Multiple Vitamin (MULTIVITAMIN) tablet Take 1 tablet by mouth daily.   Yes [provider]  omeprazole (PRILOSEC) 40 MG capsule Take 40 mg by mouth daily.   Yes [provider]  pioglitazone (ACTOS) 15 MG tablet Take 15 mg by mouth daily.   Yes [provider]  ramipril (ALTACE) 10 MG capsule Take 10 mg by mouth daily.   Yes [provider]    Family History Family History  Problem Relation Age of Onset  . Cancer Maternal Grandmother        Abdominal  . Heart attack Maternal Grandfather     Social History Social History  Substance Use Topics  . Smoking status: Never Smoker  . Smokeless tobacco: Never Used  . Alcohol use 0.0 oz/week     Comment: Rare     Allergies   Dulaglutide; Sulfa antibiotics;  Adhesive [tape]; and Amoxicillin   Review of Systems Review of Systems  Respiratory: Positive for cough.   Gastrointestinal: Positive for vomiting.  All other systems reviewed and are negative.    Physical Exam Updated Vital Signs BP (!) 144/90   Pulse 74   Temp 99.1 F (37.3 C) (Oral)   Resp 18   LMP 05/24/2017   SpO2 96%   Physical Exam  Constitutional: She is oriented to person, place, and time.  Slightly uncomfortable   HENT:  Head: Normocephalic.  MM slightly dry. No blood in OP   Eyes: Pupils are equal, round, and reactive to light. Conjunctivae and EOM are normal.  Neck: Normal range of motion. Neck supple.  Cardiovascular: Normal rate, regular rhythm and normal heart sounds.   Pulmonary/Chest:  Diminished bilateral bases   Abdominal: Soft. Bowel sounds are normal. She exhibits no distension. There is no tenderness. There is no guarding.  Obese, nontender   Musculoskeletal: Normal range of motion.  Neurological: She is alert and oriented to person, place, and time. No cranial nerve deficit. Coordination normal.  Skin: Skin is warm.  Psychiatric: She has a normal mood and affect.  Nursing note and vitals reviewed.    ED Treatments / Results  Labs (all labs ordered are listed, but only abnormal results are displayed) Labs Reviewed  COMPREHENSIVE METABOLIC PANEL - Abnormal; Notable for the following:       Result Value   Glucose, Bld 142 (*)    All other components within normal limits  CBC - Abnormal; Notable for the following:    WBC 21.8 (*)    All other components within normal limits  I-STAT CG4 LACTIC ACID, ED - Abnormal; Notable for the following:    Lactic Acid, Venous 2.07 (*)    All other components within normal limits  CULTURE, BLOOD (ROUTINE X 2)  CULTURE, BLOOD (ROUTINE X 2)  LIPASE, BLOOD  URINALYSIS, ROUTINE W REFLEX MICROSCOPIC  I-STAT CG4 LACTIC ACID, ED    EKG  EKG Interpretation None       Radiology Dg Chest 2  View  Result Date: 06/23/2017 CLINICAL DATA:  Hemoptysis EXAM: CHEST  2 VIEW COMPARISON:  03/02/2007 chest radiograph. FINDINGS: Stable cardiomediastinal silhouette with normal heart size. No pneumothorax. No pleural effusion. Extensive patchy opacity throughout the right lung, most prominent in the right middle lobe. Clear left lung. IMPRESSION: Extensive patchy opacity throughout the right lung, most prominent in the right middle lobe, presumably representing alveolar hemorrhage, with pneumonia or underlying lung mass not excluded. Recommend follow-up chest radiographs to resolution. Electronically Signed   By: Jannifer Rodney.D.  On: 06/23/2017 20:50   Ct Angio Chest Pe W And/or Wo Contrast  Result Date: 06/23/2017 CLINICAL DATA:  Mild to moderate hemoptysis, GERD, hypertension, type II diabetes mellitus, several episodes of nausea, vomiting and diarrhea EXAM: CT ANGIOGRAPHY CHEST WITH CONTRAST TECHNIQUE: Multidetector CT imaging of the chest was performed using the standard protocol during bolus administration of intravenous contrast. Multiplanar CT image reconstructions and MIPs were obtained to evaluate the vascular anatomy. CONTRAST:  100 cc Isovue 370 IV COMPARISON:  Chest radiograph 06/23/2017 FINDINGS: Cardiovascular: Aorta normal caliber without aneurysm or dissection. Degradation of image quality secondary to body habitus. Pulmonary arteries adequately opacified centrally, less well assessed in RIGHT lower lobe. No gross evidence of pulmonary embolism identified. No pericardial effusion. Mediastinum/Nodes: Inferior LEFT thyroid nodule 2.2 cm diameter. Esophagus unremarkable. Moderate-sized hiatal hernia. No thoracic adenopathy. Lungs/Pleura: Scattered patchy airspace infiltrates throughout RIGHT upper, RIGHT middle and RIGHT lower lobes, may represent pulmonary hemorrhage or infection. Minimal infiltrate LEFT upper lobe. No pleural effusion or pneumothorax. No dominant pulmonary mass. Upper  Abdomen: Unremarkable Musculoskeletal: Unremarkable Review of the MIP images confirms the above findings. IMPRESSION: No gross evidence of pulmonary embolism. Patchy airspace infiltrates throughout RIGHT lung and minimally at LEFT upper lobe, question pulmonary hemorrhage though pneumonia could cause a similar pattern. 2.2 cm diameter nodule at inferior pole LEFT thyroid gland; non emergent followup sonography recommended to assess. Electronically Signed   By: Ulyses Southward M.D.   On: 06/23/2017 23:24    Procedures Procedures (including critical care time)  Medications Ordered in ED Medications  azithromycin (ZITHROMAX) tablet 500 mg (not administered)  sodium chloride 0.9 % bolus 1,000 mL (1,000 mLs Intravenous New Bag/Given 06/23/17 2041)  ondansetron (ZOFRAN) injection 4 mg (4 mg Intravenous Given 06/23/17 2041)  pantoprazole (PROTONIX) 80 mg in sodium chloride 0.9 % 100 mL IVPB (0 mg Intravenous Stopped 06/23/17 2108)  famotidine (PEPCID) IVPB 20 mg premix (0 mg Intravenous Stopped 06/23/17 2112)  cefTRIAXone (ROCEPHIN) 1 g in dextrose 5 % 50 mL IVPB (1 g Intravenous New Bag/Given 06/23/17 2237)  iopamidol (ISOVUE-370) 76 % injection (100 mLs Intravenous Contrast Given 06/23/17 2254)     Initial Impression / Assessment and Plan / ED Course  I have reviewed the triage vital signs and the nursing notes.  Pertinent labs & imaging results that were available during my care of the patient were reviewed by me and considered in my medical decision making (see chart for details).     Paula Campos is a 51 y.o. female here with vomiting, blood tinged sputum, cough, chills. I think likely reflux with aspiration pneumonia. Not hypoxic. Will get labs, lactate, cultures, CXR. Will hydrate and give pepcid, protonix.   11:31 PM WBC 21. Hg stable at 13. Lactate 2.1. CXR showed possible pulmonary hemorrhage vs pneumonia. CTA showed infiltrates with no obvious active bleeding. No further coughing blood. I  suspect aspiration pneumonia from reflux. She is not hypoxic, well appearing, not tachycardic or hypotensive. Breathing comfortably. Blood cultures sent. I think she is stable to be treated at home. Will dc home with keflex, azithromycin. Recommend no fatty or spicy food to prevent worsening reflux and aspiration. If she has further hemoptysis, then return to the ED.    Final Clinical Impressions(s) / ED Diagnoses   Final diagnoses:  None    New Prescriptions New Prescriptions   No medications on file     Charlynne Pander, MD 06/23/17 2334

## 2017-06-23 NOTE — ED Notes (Signed)
Pt had drawn for labs:  Gold Blue Lavender Lt green Dark green x2 

## 2017-06-23 NOTE — ED Triage Notes (Signed)
Pt states that " last night, her GERD was so bad that she could sleep." This morning, she woke up and noticed she was coughing up blood plus had having several episodes of N/V/D. Denies abdominal pain.

## 2017-06-23 NOTE — Discharge Instructions (Signed)
You have aspiration pneumonia.   Take keflex three times daily for a week.   Take azithromycin as prescribed.   Avoid fatty or spicy food.   Continue taking nexium. Take pepcid 20 mg twice daily to help with reflux.   See your doctor  Return to ER if you have fever, trouble breathing, coughing up blood, vomiting blood, worse reflux, chest pain.

## 2017-06-26 ENCOUNTER — Other Ambulatory Visit: Payer: Self-pay | Admitting: Physician Assistant

## 2017-06-26 DIAGNOSIS — E041 Nontoxic single thyroid nodule: Secondary | ICD-10-CM

## 2017-06-28 LAB — CULTURE, BLOOD (ROUTINE X 2)
Culture: NO GROWTH
Culture: NO GROWTH
SPECIAL REQUESTS: ADEQUATE
Special Requests: ADEQUATE

## 2017-07-11 ENCOUNTER — Encounter: Payer: Self-pay | Admitting: Internal Medicine

## 2017-07-11 ENCOUNTER — Ambulatory Visit (INDEPENDENT_AMBULATORY_CARE_PROVIDER_SITE_OTHER): Payer: BLUE CROSS/BLUE SHIELD | Admitting: Internal Medicine

## 2017-07-11 VITALS — BP 116/76 | HR 88 | Ht 64.0 in | Wt 396.4 lb

## 2017-07-11 DIAGNOSIS — R400 Somnolence: Secondary | ICD-10-CM

## 2017-07-11 DIAGNOSIS — Z8701 Personal history of pneumonia (recurrent): Secondary | ICD-10-CM

## 2017-07-11 NOTE — Patient Instructions (Signed)
ICD-10-CM   1. History of aspiration pneumonia Z87.01   2. Daytime sleepiness R40.0     # because of diabetic drug -- you had aspiration related pneumonia - Glad this is resolved - Recommend he have a chest x-raysometime in late November or early December 2018 - Primary care can follow this to resolution  # daytime sleepiness - Epworth Sleepiness Scale score is 7 which indicates slightly higher than normal daytime sleepiness  - You might or might not have sleep apnea - If you're interested in a full workupI'm happy to refer youl to a sleep specialist  #follow-up - As needed but please make sure to get a chest x-ray on follow-up. Certainly if the infiltrates do not resolve on chest x-ray can come back

## 2017-07-11 NOTE — Progress Notes (Signed)
Subjective:    Patient ID: Paula Campos, female    DOB: 1966/02/16, 51 y.o.   MRN: [id number]  PCP Charlies Silvers, PA-C   HPI  IOV 07/11/2017  Chief Complaint  Patient presents with  . Pulm Consult    Pt referred by Dr. Daun Peacock PA for aspiration pneumonia. Pt has sinue issues right now, no chest pain or cough.    51 year-old morbidly obese female with type 2 diabetes. She says that sometime late August early September 2018 she was started on trulicity. This is a high incidence of GI side effects. Therefore her acid reflux got worse. Then 1 time in the middle of the night October 5 0r 6 2018 she had significantacid reflux episode and aspirated this and ended up in the emergency department with right-sided pneumonia is confirmed on the CT scan of the chest that I personally visualized. Pulm embolism was ruled out. She got antibiotics and has come off trulicity and she feels back to baseline. She has follow-up chest x-ray in November 2018 with a primary care physician. She's not fully sure why she is here today because she feels fine from a respiratory standpointthis no current hemoptysis or any other respiratory issues. She believes the primary care physician is concerned about sleep apnea but she he does not snore. In fact some 2 months ago went on vacation with mom did not report any snoring. She denies any excessive daytime somnolence. Epworth sleepiness score is 7 slightly more than average      has a past medical history of Anxiety; Asthma; Complication of anesthesia; Diabetes mellitus without complication (HCC); Elevated cholesterol; GERD (gastroesophageal reflux disease); History of hiatal hernia; History of kidney stones; and Hypertension.   reports that she has never smoked. She has never used smokeless tobacco.  Past Surgical History:  Procedure Laterality Date  . Broken ankle Right 2008  . CESAREAN SECTION  24 1/2 yrs ago   x 1  . COLONOSCOPY WITH PROPOFOL N/A  12/07/2015   Procedure: COLONOSCOPY WITH PROPOFOL;  Surgeon: Charna Elizabeth, MD;  Location: [facility];  Service: Endoscopy;  Laterality: N/A;  . lithotrispy  2011 or 2012    Allergies  Allergen Reactions  . Dulaglutide Nausea And Vomiting    Bloating, gas  . Sulfa Antibiotics Dermatitis  . Adhesive [Tape] Rash  . Amoxicillin Rash    Has patient had a PCN reaction causing immediate rash, facial/tongue/throat swelling, SOB or lightheadedness with hypotension: No Has patient had a PCN reaction causing severe rash involving mucus membranes or skin necrosis: No Has patient had a PCN reaction that required hospitalization No Has patient had a PCN reaction occurring within the last 10 years: No If all of the above answers are "NO", then may proceed with Cephalosporin use.     There is no immunization history on file for this patient.  Family History  Problem Relation Age of Onset  . Cancer Maternal Grandmother        Abdominal  . Heart attack Maternal Grandfather      Current Outpatient Prescriptions:  .  albuterol (PROVENTIL HFA;VENTOLIN HFA) 108 (90 Base) MCG/ACT inhaler, Inhale 1 puff into the lungs daily as needed for shortness of breath., Disp: , Rfl:  .  ALPRAZolam (XANAX) 0.5 MG tablet, Take 0.5 mg by mouth at bedtime as needed for anxiety., Disp: , Rfl:  .  Ascorbic Acid (VITAMIN C) 1000 MG tablet, Take 1,000 mg by mouth daily., Disp: , Rfl:  .  atorvastatin (LIPITOR) 40 MG tablet, Take 40 mg by mouth daily., Disp: , Rfl: 1 .  azithromycin (ZITHROMAX Z-PAK) 250 MG tablet, Take 250 mg daily, Disp: 5 tablet, Rfl: 0 .  Calcium Carbonate-Vitamin D (CALCIUM + D PO), Take 2 tablets by mouth daily. , Disp: , Rfl:  .  cephALEXin (KEFLEX) 500 MG capsule, Take 1 capsule (500 mg total) by mouth 3 (three) times daily., Disp: 21 capsule, Rfl: 0 .  EQ FIBER SUPPLEMENT PO, Take 2 tablets by mouth daily., Disp: , Rfl:  .  famotidine (PEPCID) 20 MG tablet, Take 1 tablet (20 mg total) by mouth 2  (two) times daily., Disp: 10 tablet, Rfl: 0 .  fluticasone (FLONASE) 50 MCG/ACT nasal spray, Place 1 spray into both nostrils daily as needed for allergies., Disp: , Rfl: 0 .  montelukast (SINGULAIR) 10 MG tablet, Take 10 mg by mouth at bedtime., Disp: , Rfl:  .  Multiple Vitamin (MULTIVITAMIN) tablet, Take 1 tablet by mouth daily., Disp: , Rfl:  .  omeprazole (PRILOSEC) 40 MG capsule, Take 40 mg by mouth daily., Disp: , Rfl:  .  pioglitazone (ACTOS) 15 MG tablet, Take 15 mg by mouth daily., Disp: , Rfl:  .  ramipril (ALTACE) 10 MG capsule, Take 10 mg by mouth daily., Disp: , Rfl:    Review of Systems  Constitutional: Negative for fever and unexpected weight change.  HENT: Positive for congestion, dental problem, ear pain, postnasal drip, sinus pain, sinus pressure, sneezing and voice change. Negative for nosebleeds, rhinorrhea, sore throat and trouble swallowing.   Eyes: Negative for redness and itching.  Respiratory: Positive for cough and wheezing. Negative for chest tightness and shortness of breath.   Cardiovascular: Positive for leg swelling. Negative for palpitations.  Gastrointestinal: Negative for nausea and vomiting.  Genitourinary: Negative for dysuria.  Musculoskeletal: Positive for joint swelling.  Skin: Negative for rash.  Allergic/Immunologic: Positive for environmental allergies. Negative for food allergies and immunocompromised state.  Neurological: Negative for headaches.  Hematological: Does not bruise/bleed easily.  Psychiatric/Behavioral: Negative for dysphoric mood. The patient is nervous/anxious.        Objective:   Physical Exam  Constitutional: She is oriented to person, place, and time. She appears well-developed and well-nourished. No distress.  HENT:  Head: Normocephalic and atraumatic.  Right Ear: External ear normal.  Left Ear: External ear normal.  Mouth/Throat: Oropharynx is clear and moist. No oropharyngeal exudate.  Eyes: Pupils are equal, round,  and reactive to light. Conjunctivae and EOM are normal. Right eye exhibits no discharge. Left eye exhibits no discharge. No scleral icterus.  Neck: Normal range of motion. Neck supple. No JVD present. No tracheal deviation present. No thyromegaly present.  Cardiovascular: Normal rate, regular rhythm, normal heart sounds and intact distal pulses.  Exam reveals no gallop and no friction rub.   No murmur heard. Pulmonary/Chest: Effort normal and breath sounds normal. No respiratory distress. She has no wheezes. She has no rales. She exhibits no tenderness.  Abdominal: Soft. Bowel sounds are normal. She exhibits no distension and no mass. There is no tenderness. There is no rebound and no guarding.  Musculoskeletal: Normal range of motion. She exhibits no edema or tenderness.  Lymphadenopathy:    She has no cervical adenopathy.  Neurological: She is alert and oriented to person, place, and time. She has normal reflexes. No cranial nerve deficit. She exhibits normal muscle tone. Coordination normal.  Skin: Skin is warm and dry. No rash noted. She is not diaphoretic. No  erythema. No pallor.  Psychiatric: She has a normal mood and affect. Her behavior is normal. Judgment and thought content normal.  Vitals reviewed.  Vitals:   07/11/17 1514  BP: 116/76  Pulse: 88  SpO2: 98%  Weight: (!) 396 lb 6.4 oz (179.8 kg)  Height: 5\' 4"  (1.626 m)    Estimated body mass index is 68.04 kg/m as calculated from the following:   Height as of this encounter: 5\' 4"  (1.626 m).   Weight as of this encounter: 396 lb 6.4 oz (179.8 kg).   .     Assessment & Plan:     ICD-10-CM   1. History of aspiration pneumonia Z87.01   2. Daytime sleepiness R40.0      # because of diabetic drug -- you had aspiration related pneumonia - Glad this is resolved - Recommend he have a chest x-raysometime in late November or early December 2018 - Primary care can follow this to resolution  # daytime sleepiness - Epworth  Sleepiness Scale score is 7 which indicates slightly higher than normal daytime sleepiness  - You might or might not have sleep apnea - If you're interested in a full workupI'm happy to refer youl to a sleep specialist  #follow-up - As needed but please make sure to get a chest x-ray on follow-up. Certainly if the infiltrates do not resolve on chest x-ray can come back   Dr. Kalman ShanMurali Jsean Taussig, M.D., [facility] CenterF.C.C.P Pulmonary and Critical Care Medicine Staff Physician [facility] Pager: [phone], If no answer or between  15:00h - 7:00h: call [phone]  07/11/2017 3:52 PM

## 2017-07-23 ENCOUNTER — Other Ambulatory Visit: Payer: Self-pay | Admitting: Gynecology

## 2017-07-23 DIAGNOSIS — Z1231 Encounter for screening mammogram for malignant neoplasm of breast: Secondary | ICD-10-CM

## 2017-07-31 ENCOUNTER — Other Ambulatory Visit: Payer: Self-pay | Admitting: Physician Assistant

## 2017-07-31 ENCOUNTER — Ambulatory Visit
Admission: RE | Admit: 2017-07-31 | Discharge: 2017-07-31 | Disposition: A | Discharge status: Home / Self Care | Payer: BLUE CROSS/BLUE SHIELD | Source: Ambulatory Visit | Attending: Physician Assistant | Admitting: Physician Assistant

## 2017-07-31 DIAGNOSIS — J690 Pneumonitis due to inhalation of food and vomit: Secondary | ICD-10-CM

## 2017-07-31 DIAGNOSIS — E041 Nontoxic single thyroid nodule: Secondary | ICD-10-CM

## 2017-08-01 ENCOUNTER — Ambulatory Visit
Admission: RE | Admit: 2017-08-01 | Discharge: 2017-08-01 | Disposition: A | Discharge status: Home / Self Care | Payer: BLUE CROSS/BLUE SHIELD | Source: Ambulatory Visit | Attending: Gynecology | Admitting: Gynecology

## 2017-08-01 DIAGNOSIS — Z1231 Encounter for screening mammogram for malignant neoplasm of breast: Secondary | ICD-10-CM | POA: Diagnosis not present

## 2017-08-03 ENCOUNTER — Encounter: Payer: BLUE CROSS/BLUE SHIELD | Admitting: Gynecology

## 2017-08-06 ENCOUNTER — Encounter: Payer: Self-pay | Admitting: Gynecology

## 2017-08-06 ENCOUNTER — Ambulatory Visit (INDEPENDENT_AMBULATORY_CARE_PROVIDER_SITE_OTHER): Payer: BLUE CROSS/BLUE SHIELD | Admitting: Gynecology

## 2017-08-06 VITALS — BP 136/84 | Ht 64.0 in | Wt 395.0 lb

## 2017-08-06 DIAGNOSIS — Z01419 Encounter for gynecological examination (general) (routine) without abnormal findings: Secondary | ICD-10-CM

## 2017-08-06 NOTE — Progress Notes (Signed)
    Paula BlalockLeah L Campos 01/13/1966 [id number]        51 y.o.  G2P0011 for annual gynecologic exam.  Doing well without complaints.  Menses are still fairly regular although they will alternate between regular and very light flow.  No prolonged or atypical bleeding in between.  No long skips.  Past medical history,surgical history, problem list, medications, allergies, family history and social history were all reviewed and documented as reviewed in the EPIC chart.  ROS:  Performed with pertinent positives and negatives included in the history, assessment and plan.   Additional significant findings : None   Exam: Kennon PortelaKim Gardner assistant Vitals:   08/06/17 1613  BP: 136/84  Weight: (!) 395 lb (179.2 kg)  Height: 5\' 4"  (1.626 m)   Body mass index is 67.8 kg/m.  General appearance:  Normal affect, orientation and appearance. Skin: Grossly normal HEENT: Without gross lesions.  No cervical or supraclavicular adenopathy. Thyroid normal.  Lungs:  Clear without wheezing, rales or rhonchi Cardiac: RR, without RMG Abdominal:  Soft, nontender, without masses, guarding, rebound, organomegaly or hernia Breasts:  Examined lying and sitting without masses, retractions, discharge or axillary adenopathy. Pelvic:  Ext, BUS, Vagina: Normal.  Left labia minora more prominent than right as always.  Cervix: Normal  Uterus: Unable to palpate.  No gross masses or tenderness  Adnexa: Without gross masses or tenderness    Anus and perineum: Normal   Rectovaginal: Normal sphincter tone without palpated masses or tenderness.    Assessment/Plan:  51 y.o. 292P0011 female for annual gynecologic exam with regular menses, vasectomy birth control.   1. Regular menses.  Alternate from very light to regular and flow.  No prolonged skips.  No prolonged bleeding.  Continue to monitor and report any irregular bleeding other than spacing them out.  Not having significant hot flushes or sweats. 2. Pap smear/HPV 05/2014.  No Pap  smear done today.  No history of significant abnormal Pap smears.  Plan repeat Pap smear at 5-year interval per current screening guidelines. 3. Mammography 07/2017.  Continue with annual mammography next year.  Breast exam normal today.  SBE monthly reviewed. 4. Colonoscopy 2017.  Repeat at their recommended interval. 5. Health maintenance.  No routine lab work done as patient does this elsewhere.  Follow-up 1 year, sooner as needed.   Dara Lordsimothy P Izen Petz MD, 4:39 PM 08/06/2017

## 2017-08-06 NOTE — Patient Instructions (Addendum)
Follow-up in 1 year for annual exam.  Follow-up sooner if any issues or bleeding become significantly irregular.

## 2017-08-07 ENCOUNTER — Other Ambulatory Visit: Payer: Self-pay | Admitting: Physician Assistant

## 2017-08-07 DIAGNOSIS — E041 Nontoxic single thyroid nodule: Secondary | ICD-10-CM

## 2017-08-14 ENCOUNTER — Other Ambulatory Visit (HOSPITAL_COMMUNITY)
Admission: RE | Admit: 2017-08-14 | Discharge: 2017-08-14 | Disposition: A | Discharge status: Home / Self Care | Payer: BLUE CROSS/BLUE SHIELD | Source: Ambulatory Visit | Attending: Physician Assistant | Admitting: Physician Assistant

## 2017-08-14 ENCOUNTER — Ambulatory Visit
Admission: RE | Admit: 2017-08-14 | Discharge: 2017-08-14 | Disposition: A | Discharge status: Home / Self Care | Payer: BLUE CROSS/BLUE SHIELD | Source: Ambulatory Visit | Attending: Physician Assistant | Admitting: Physician Assistant

## 2017-08-14 DIAGNOSIS — E041 Nontoxic single thyroid nodule: Secondary | ICD-10-CM

## 2017-08-14 NOTE — Procedures (Signed)
PROCEDURE SUMMARY:  Using direct ultrasound guidance, 4 passes were made using 25 g needles into the nodule within the left lobe of the thyroid.   Ultrasound was used to confirm needle placements on all occasions.   Specimens were sent to Pathology for analysis.  Erroll Wilbourne S Tamy Accardo PA-C 08/14/2017 4:28 PM

## 2018-07-31 ENCOUNTER — Other Ambulatory Visit: Payer: Self-pay | Admitting: Family Medicine

## 2018-07-31 DIAGNOSIS — Z1231 Encounter for screening mammogram for malignant neoplasm of breast: Secondary | ICD-10-CM

## 2018-08-07 ENCOUNTER — Ambulatory Visit (INDEPENDENT_AMBULATORY_CARE_PROVIDER_SITE_OTHER): Payer: BLUE CROSS/BLUE SHIELD | Admitting: Gynecology

## 2018-08-07 ENCOUNTER — Encounter: Payer: Self-pay | Admitting: Gynecology

## 2018-08-07 VITALS — BP 130/84 | Ht 64.0 in | Wt 378.0 lb

## 2018-08-07 DIAGNOSIS — Z01419 Encounter for gynecological examination (general) (routine) without abnormal findings: Secondary | ICD-10-CM

## 2018-08-07 DIAGNOSIS — Z1151 Encounter for screening for human papillomavirus (HPV): Secondary | ICD-10-CM | POA: Diagnosis not present

## 2018-08-07 NOTE — Patient Instructions (Signed)
Follow-up if prolonged or atypical vaginal bleeding.  Follow-up in 1 year for annual exam.

## 2018-08-07 NOTE — Addendum Note (Signed)
Addended by: Dayna BarkerGARDNER, Pakou Rainbow K on: 08/07/2018 04:41 PM   Modules accepted: Orders

## 2018-08-07 NOTE — Progress Notes (Signed)
    Paula BlalockLeah L Campos 1966/06/19 [id number]        52 y.o.  G2P0011 for annual gynecologic exam.  Continues to have monthly menses.  Will sometimes stretch out to 50 days but no longer.  No prolonged or atypical bleeding.  No significant menopausal symptoms such as hot flushes or sweats.  Past medical history,surgical history, problem list, medications, allergies, family history and social history were all reviewed and documented as reviewed in the EPIC chart.  ROS:  Performed with pertinent positives and negatives included in the history, assessment and plan.   Additional significant findings : None   Exam: Kennon PortelaKim Gardner assistant Vitals:   08/07/18 1606  BP: 130/84  Weight: (!) 378 lb (171.5 kg)  Height: 5\' 4"  (1.626 m)   Body mass index is 64.88 kg/m.  General appearance:  Normal affect, orientation and appearance. Skin: Grossly normal HEENT: Without gross lesions.  No cervical or supraclavicular adenopathy. Thyroid normal.  Lungs:  Clear without wheezing, rales or rhonchi Cardiac: RR, without RMG Abdominal:  Soft, nontender, without masses, guarding, rebound, organomegaly or hernia Breasts:  Examined lying and sitting without masses, retractions, discharge or axillary adenopathy. Pelvic:  Ext, BUS, Vagina: Normal with left labia minora more prominent than right as always  Cervix: Normal.  Pap smear/HPV  Uterus: Unable to palpate but no gross masses or tenderness  Adnexa: Without gross masses or tenderness    Anus and perineum: Normal   Rectovaginal: Normal sphincter tone without palpated masses or tenderness.    Assessment/Plan:  52 y.o. 502P0011 female for annual gynecologic exam with regular menses, vasectomy birth control.   1. Continues with regular menses.  May space out to 50 days but no longer.  No prolonged or atypical bleeding between.  No menopausal symptoms.  Will continue with menstrual calendar this coming year.  We discussed what to expect in the perimenopause.  Will  call if she has prolonged or atypical bleeding. 2. Pap smear/HPV 05/2014.  Pap smear/HPV today.  No history of significant abnormal Pap smears previously. 3. Mammography 07/2017.  Has mammogram scheduled and will follow-up for this.  Breast exam normal today. 4. Colonoscopy 2017.  Repeat at their recommended interval. 5. Health maintenance.  No routine lab work done as patient does this elsewhere.  Follow-up 1 year, sooner as needed.     Dara Lordsimothy P Rushil Kimbrell MD, 4:30 PM 08/07/2018

## 2018-08-08 ENCOUNTER — Ambulatory Visit
Admission: RE | Admit: 2018-08-08 | Discharge: 2018-08-08 | Disposition: A | Discharge status: Home / Self Care | Payer: BLUE CROSS/BLUE SHIELD | Source: Ambulatory Visit | Attending: Family Medicine | Admitting: Family Medicine

## 2018-08-08 ENCOUNTER — Encounter: Payer: Self-pay | Admitting: Radiology

## 2018-08-08 DIAGNOSIS — Z1231 Encounter for screening mammogram for malignant neoplasm of breast: Secondary | ICD-10-CM | POA: Diagnosis present

## 2018-08-08 LAB — PAP IG AND HPV HIGH-RISK: HPV DNA HIGH RISK: NOT DETECTED

## 2018-09-17 IMAGING — CT CT ANGIO CHEST
2 of 6 series · 18 of 36 positions shown · IV contrast (ISOVUE 370)
Comparison: Chest radiograph 06/23/2017

CLINICAL DATA: Mild to moderate hemoptysis, GERD, hypertension,
type II diabetes mellitus, several episodes of nausea, vomiting and
diarrhea

EXAM:
CT ANGIOGRAPHY CHEST WITH CONTRAST
TECHNIQUE: Multidetector CT imaging of the chest was performed using the
standard protocol during bolus administration of intravenous
contrast. Multiplanar CT image reconstructions and MIPs were
obtained to evaluate the vascular anatomy.
CONTRAST:  100 cc Isovue 370 IV

[Series 5: coronal mpr · coronal · 0.50mm/px · 1 of 151 slices shown]
[im 76/151  mediastinal]
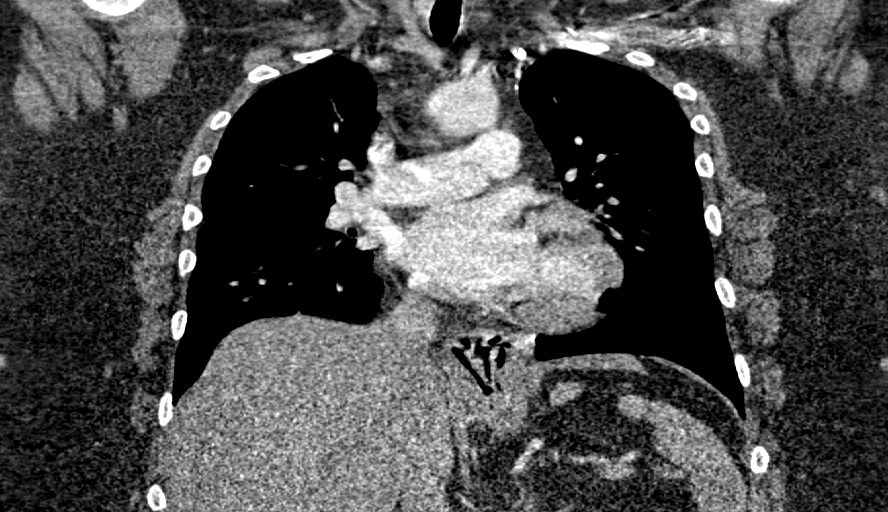

[Series 10: thins for pacs · axial · 0.74mm/px · z∈[+1451,+1682]mm · 17 of 257 slices shown]
[im 13/257  lung]
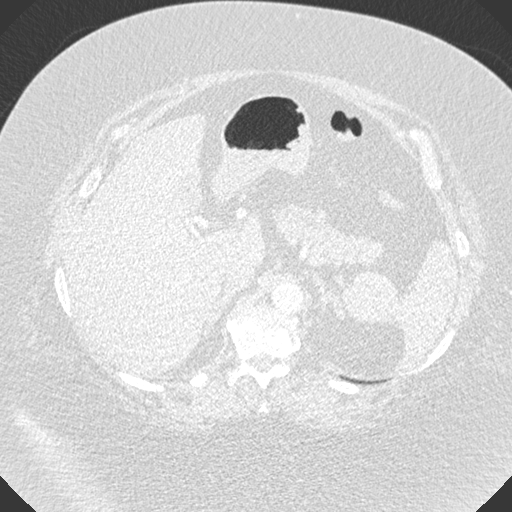
[im 26/257  mediastinal]
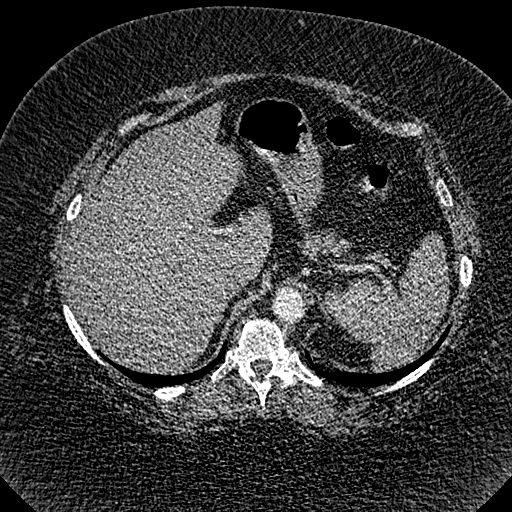
[im 39/257  lung]
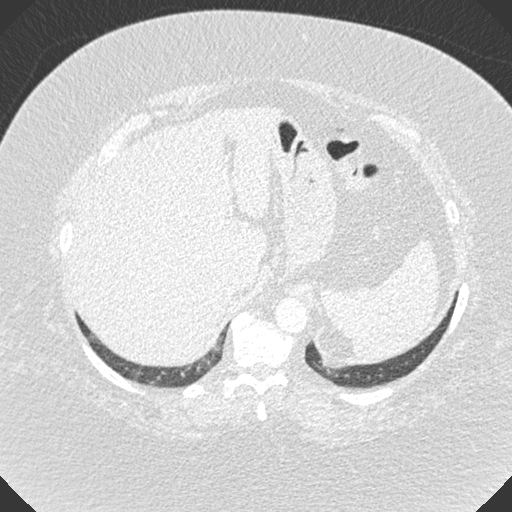
[im 52/257  mediastinal]
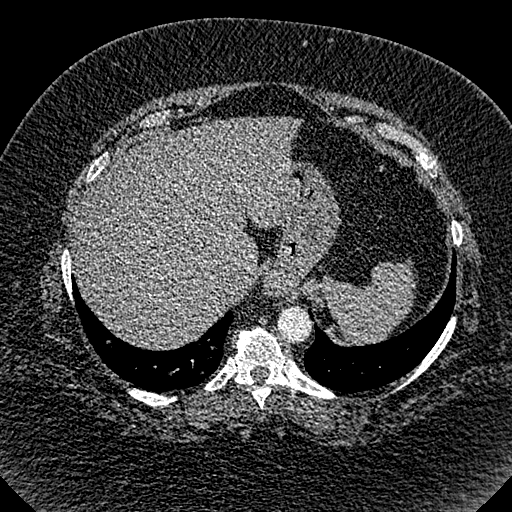
[im 77/257  lung]
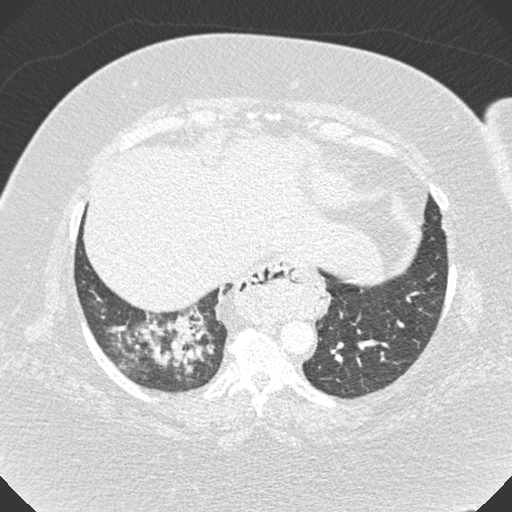
[im 90/257  mediastinal]
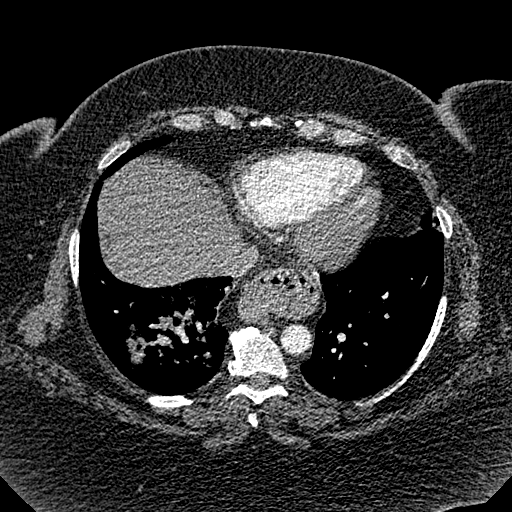
[im 103/257  lung]
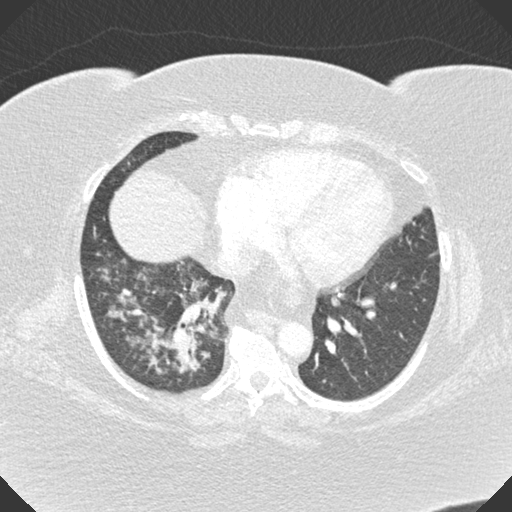
[im 116/257  mediastinal]
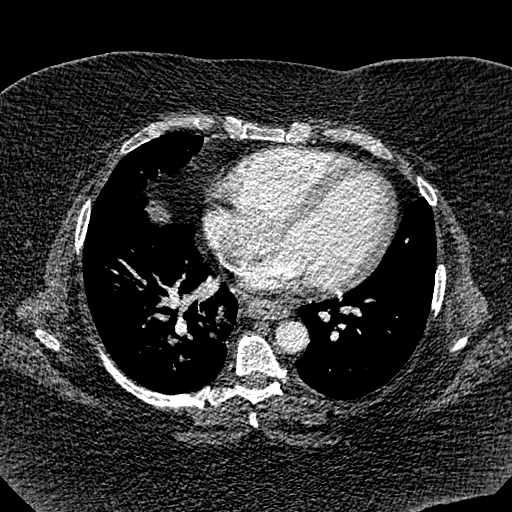
[im 129/257  lung]
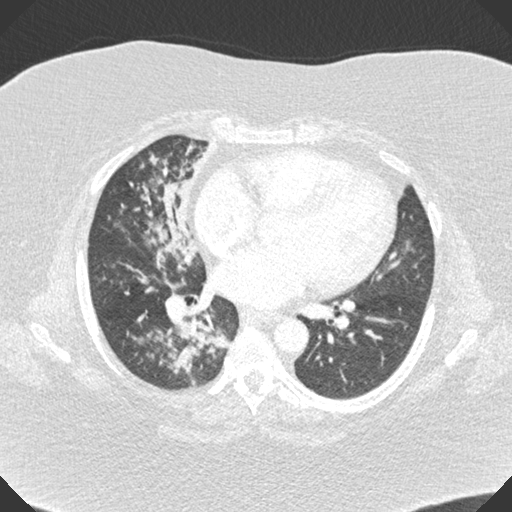
[im 141/257  mediastinal]
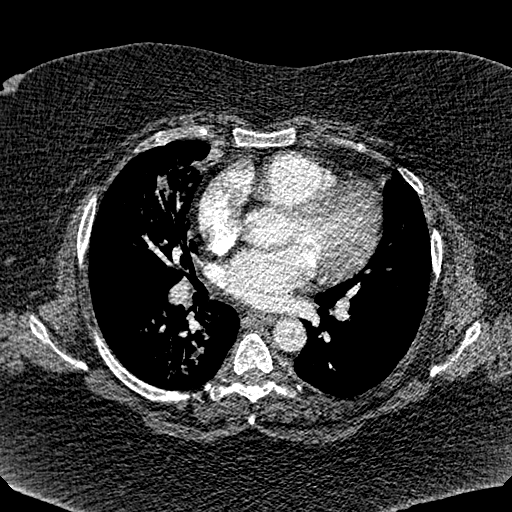
[im 154/257  lung]
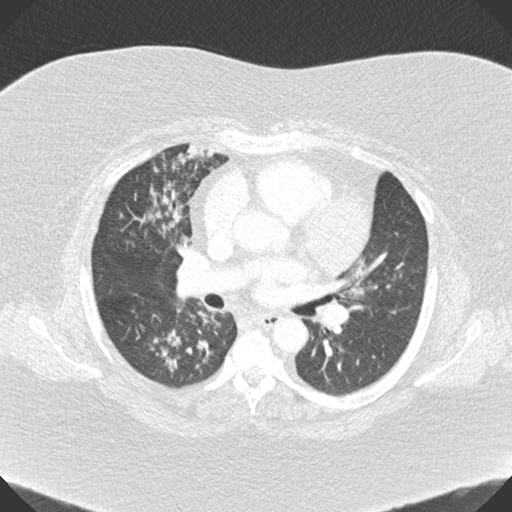
[im 167/257  mediastinal]
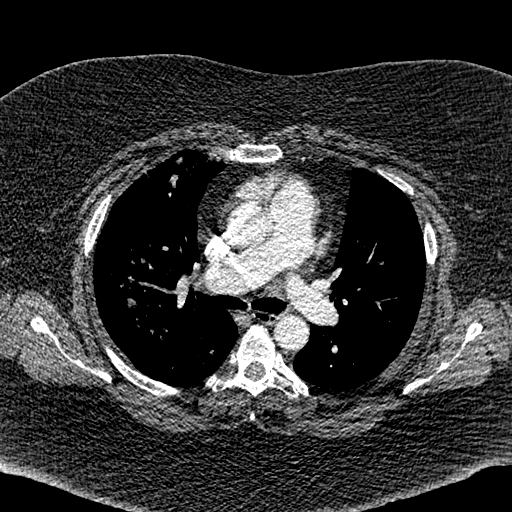
[im 180/257  lung]
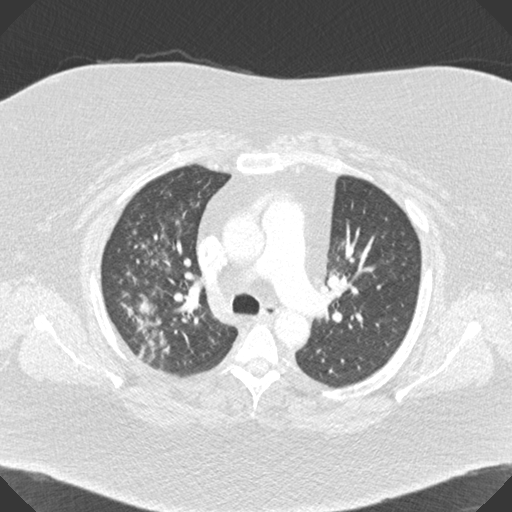
[im 205/257  mediastinal]
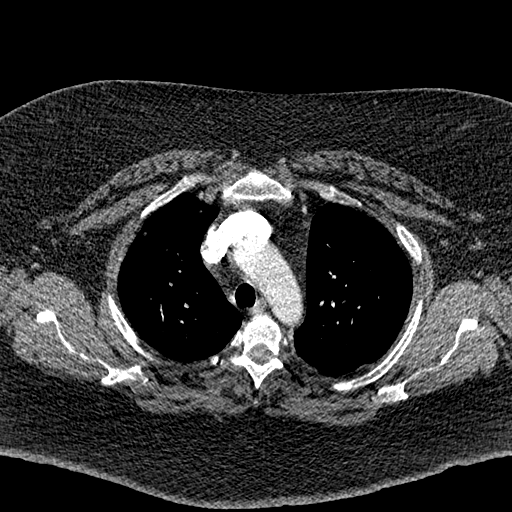
[im 218/257  lung]
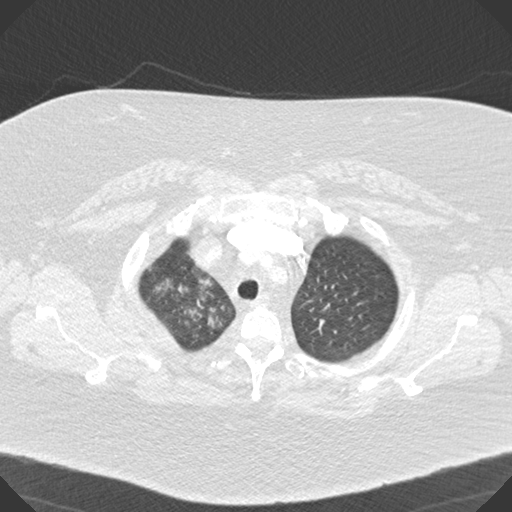
[im 231/257  mediastinal]
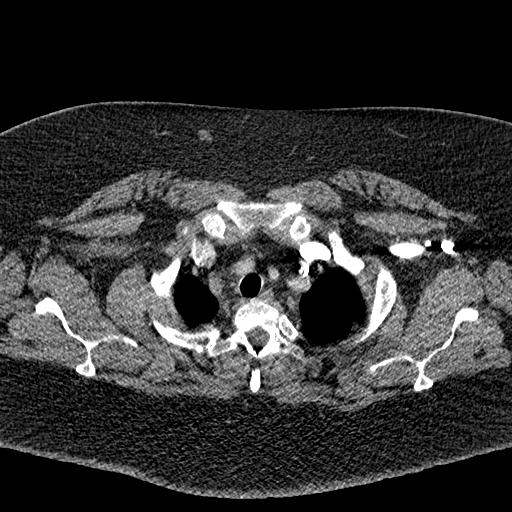
[im 244/257  lung]
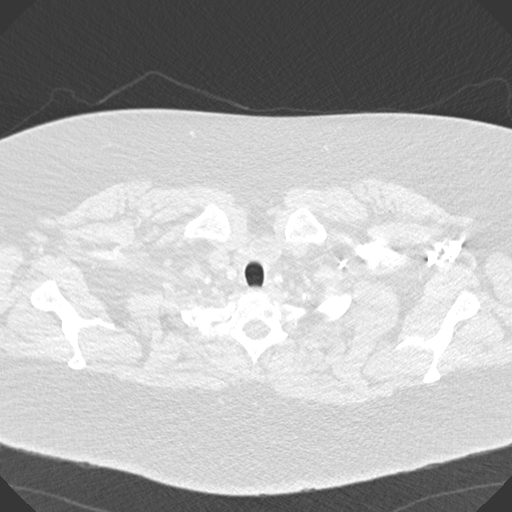

[18 of 36 positions shown; findings below may reference images not displayed]

FINDINGS: Cardiovascular: Aorta normal caliber without aneurysm or dissection.
Degradation of image quality secondary to body habitus. Pulmonary
arteries adequately opacified centrally, less well assessed in RIGHT
lower lobe. No gross evidence of pulmonary embolism identified. No
pericardial effusion.

Mediastinum/Nodes: Inferior LEFT thyroid nodule 2.2 cm diameter.
Esophagus unremarkable. Moderate-sized hiatal hernia. No thoracic
adenopathy.

Lungs/Pleura: Scattered patchy airspace infiltrates throughout RIGHT
upper, RIGHT middle and RIGHT lower lobes, may represent pulmonary
hemorrhage or infection. Minimal infiltrate LEFT upper lobe. No
pleural effusion or pneumothorax. No dominant pulmonary mass.

Upper Abdomen: Unremarkable

Musculoskeletal: Unremarkable

Review of the MIP images confirms the above findings.
IMPRESSION: No gross evidence of pulmonary embolism.

Patchy airspace infiltrates throughout RIGHT lung and minimally at
LEFT upper lobe, question pulmonary hemorrhage though pneumonia
could cause a similar pattern.

2.2 cm diameter nodule at inferior pole LEFT thyroid gland; non
emergent followup sonography recommended to assess.

## 2018-09-17 IMAGING — CR DG CHEST 2V
2 series · 2 of 2 positions shown · non-contrast
Comparison: 03/02/2007 chest radiograph.

CLINICAL DATA: Hemoptysis

EXAM:
CHEST  2 VIEW

[w chest pa]
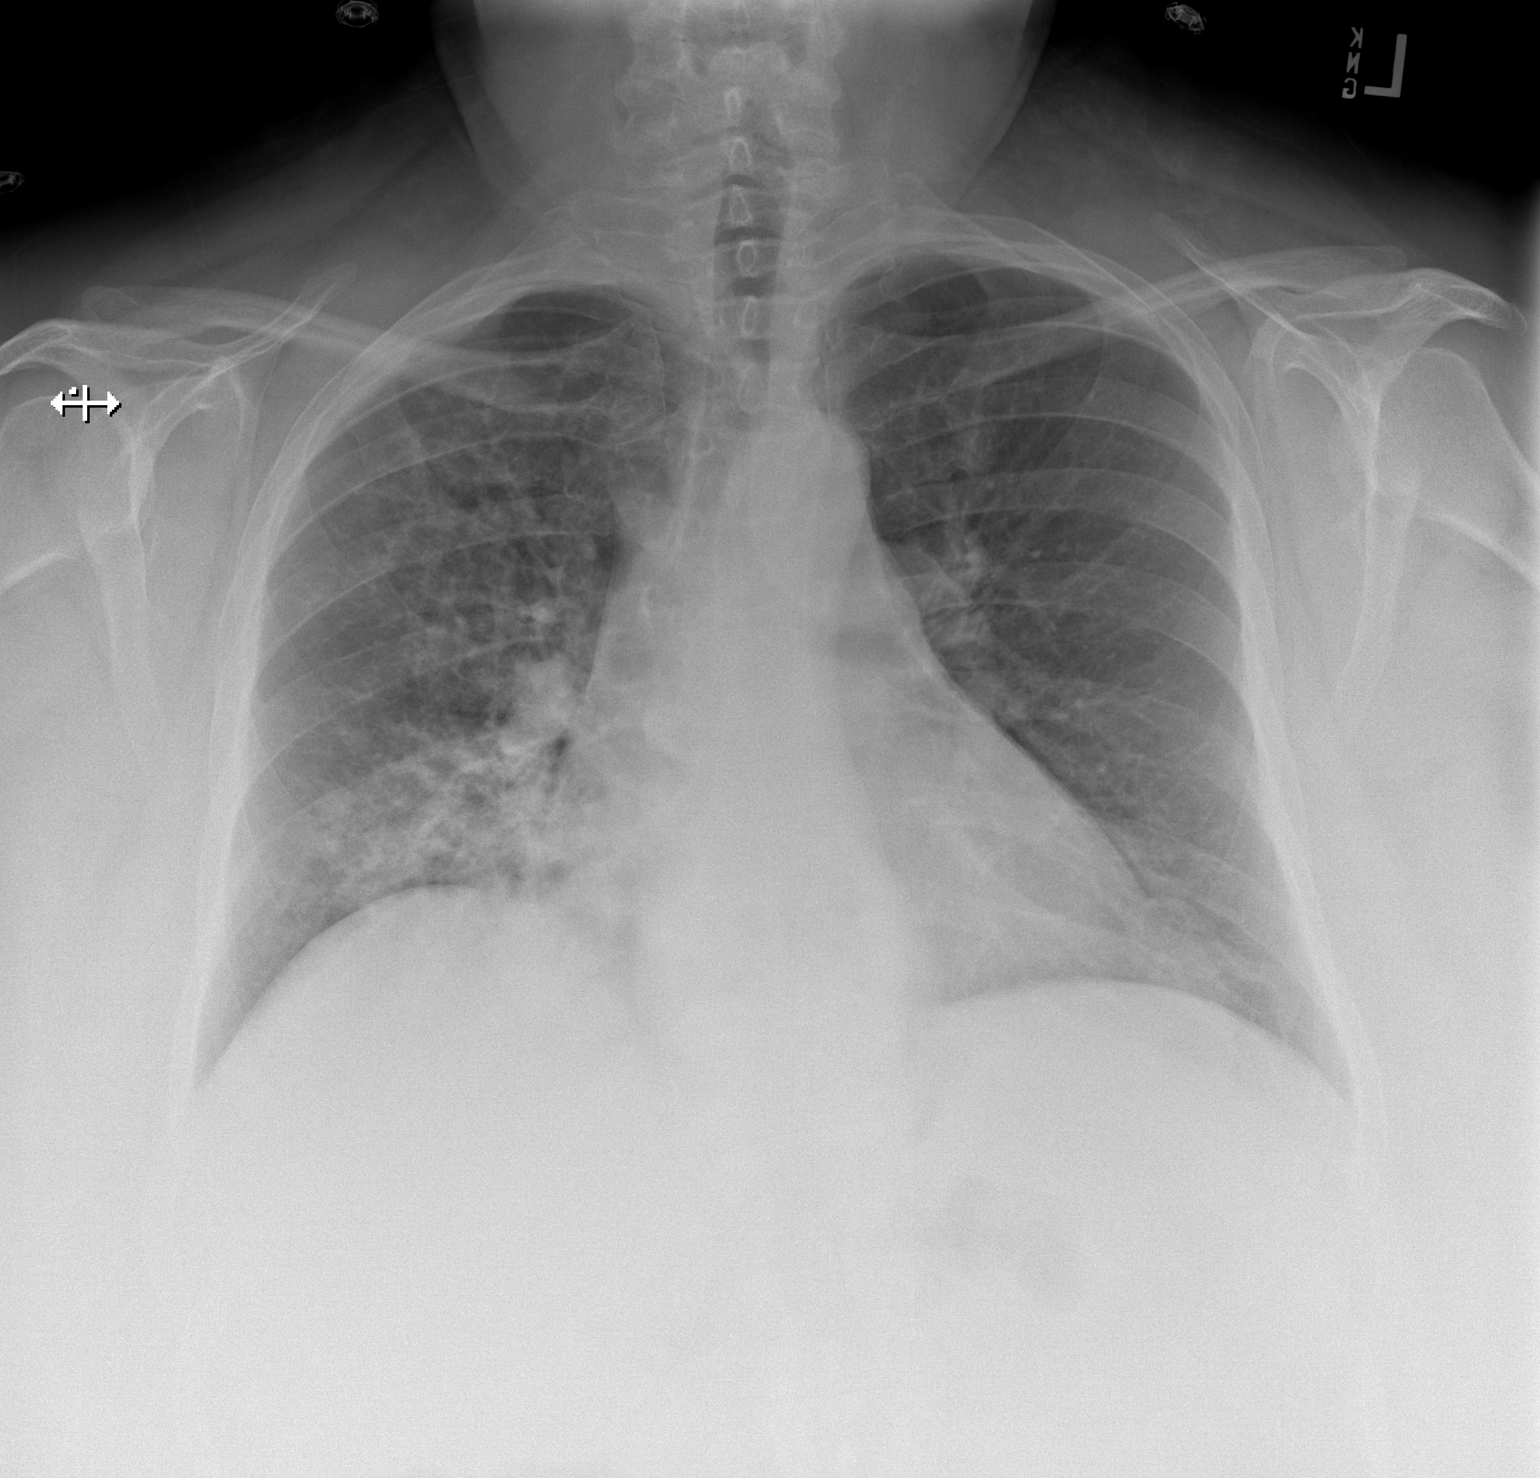

[w chest lat]
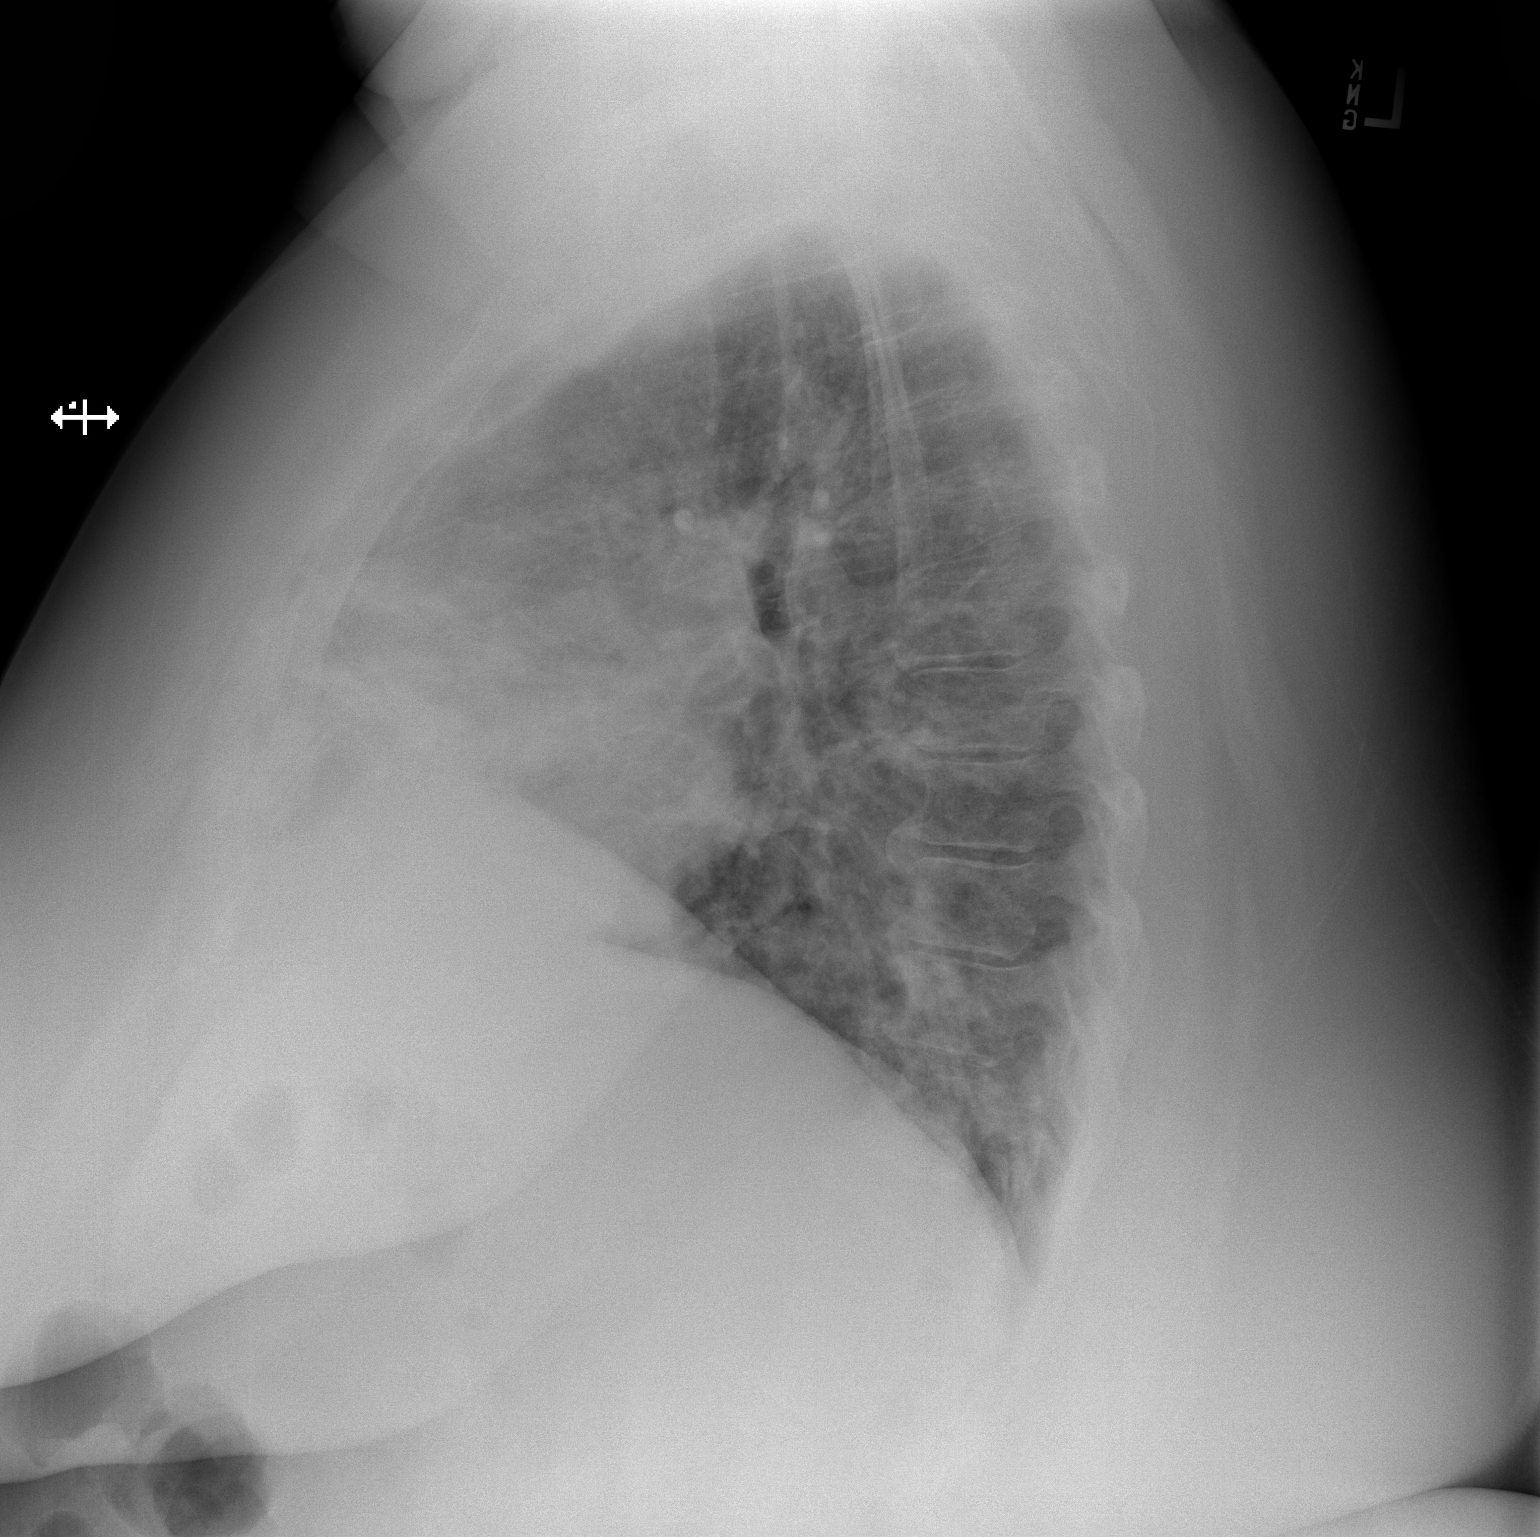

[2 of 2 positions shown; findings below may reference images not displayed]

FINDINGS: Stable cardiomediastinal silhouette with normal heart size. No
pneumothorax. No pleural effusion. Extensive patchy opacity
throughout the right lung, most prominent in the right middle lobe.
Clear left lung.
IMPRESSION: Extensive patchy opacity throughout the right lung, most prominent
in the right middle lobe, presumably representing alveolar
hemorrhage, with pneumonia or underlying lung mass not excluded.
Recommend follow-up chest radiographs to resolution.

## 2018-12-18 DIAGNOSIS — E782 Mixed hyperlipidemia: Secondary | ICD-10-CM | POA: Insufficient documentation

## 2019-06-11 ENCOUNTER — Encounter: Payer: Self-pay | Admitting: Gynecology

## 2019-06-19 ENCOUNTER — Other Ambulatory Visit: Payer: Self-pay

## 2019-06-19 ENCOUNTER — Ambulatory Visit: Payer: BC Managed Care – PPO | Admitting: Cardiology

## 2019-06-19 ENCOUNTER — Encounter: Payer: Self-pay | Admitting: Cardiology

## 2019-06-19 VITALS — BP 127/64 | HR 80 | Temp 97.8°F | Ht 64.0 in | Wt 378.0 lb

## 2019-06-19 DIAGNOSIS — E782 Mixed hyperlipidemia: Secondary | ICD-10-CM

## 2019-06-19 DIAGNOSIS — R002 Palpitations: Secondary | ICD-10-CM | POA: Diagnosis not present

## 2019-06-19 DIAGNOSIS — R0789 Other chest pain: Secondary | ICD-10-CM

## 2019-06-19 DIAGNOSIS — I10 Essential (primary) hypertension: Secondary | ICD-10-CM | POA: Diagnosis not present

## 2019-06-19 NOTE — Progress Notes (Signed)
Patient referred by Nickola Major, MD for palpitations  Subjective:   Paula Campos, female    DOB: Dec 04, 1965, 53 y.o.   MRN: [id number]   Chief Complaint  Patient presents with  . Palpitations  . New Patient (Initial Visit)     HPI  53 y/o Caucasian female with hypertension, hyperlipidemia, type 2 DM, morbid obesity BMI>60, referred for evaluation of palpitations.   Patient ha noticed episodes of sudden increase in her heart rate at rest upwards of 100 bpm.  These episodes occur at variable time intervals.  Her most recent episodes were in September.  Last few weeks, she has not experienced any such episodes.  During her tachycardia episodes, she occasionally reports chest pain.  She does not recall any chest pain with exertion.  She has stable dyspnea on exertion with no significant change.  She denies any orthopnea, PND, leg edema.  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  Her work is mostly that up to stop.  Her physical activity has been limited during the pandemic.  She is trying to walk more and make significant diet and lifestyle changes.  She has lost 40 pounds prior to beginning of the pandemic but has managed to gain some of the weight back.  She was supposed to see weight loss surgery clinic at [facility], which has been ordered during the pandemic.   Past Medical History:  Diagnosis Date  . Anxiety   . Asthma    Broncitis-"Bronchial asthma"-only uses singulair-no Inhalers required.  . Complication of anesthesia    slow  for epidural to wear off after c section  . Diabetes mellitus without complication ([place])    type 2  . Elevated cholesterol   . GERD (gastroesophageal reflux disease)   . History of hiatal hernia   . History of kidney stones   . Hypertension   . Pneumonia   . Thyroid nodule      Past Surgical History:  Procedure Laterality Date  . Broken ankle Right 2008  . CESAREAN SECTION  24 1/2 yrs ago   x 1  . COLONOSCOPY WITH PROPOFOL N/A  12/07/2015   Procedure: COLONOSCOPY WITH PROPOFOL;  Surgeon: Juanita Craver, MD;  Location: [facility];  Service: Endoscopy;  Laterality: N/A;  . lithotrispy  2011 or 2012     Social History   Socioeconomic History  . Marital status: Divorced    Spouse name: Not on file  . Number of children: Not on file  . Years of education: Not on file  . Highest education level: Not on file  Occupational History  . Not on file  Social Needs  . Financial resource strain: Not on file  . Food insecurity    Worry: Not on file    Inability: Not on file  . Transportation needs    Medical: Not on file    Non-medical: Not on file  Tobacco Use  . Smoking status: Never Smoker  . Smokeless tobacco: Never Used  Substance and Sexual Activity  . Alcohol use: Yes    Alcohol/week: 0.0 standard drinks    Comment: Rare  . Drug use: No  . Sexual activity: Not Currently    Birth control/protection: Other-see comments    Comment: Vasectomy-1st intercourse 63 yo-5 partners  Lifestyle  . Physical activity    Days per week: Not on file    Minutes per session: Not on file  . Stress: Not on file  Relationships  . Social  connections    Talks on phone: Not on file    Gets together: Not on file    Attends religious service: Not on file    Active member of club or organization: Not on file    Attends meetings of clubs or organizations: Not on file    Relationship status: Not on file  . Intimate partner violence    Fear of current or ex partner: Not on file    Emotionally abused: Not on file    Physically abused: Not on file    Forced sexual activity: Not on file  Other Topics Concern  . Not on file  Social History Narrative  . Not on file    Family History  Problem Relation Age of Onset  . Cancer Maternal Grandmother        Abdominal  . Heart attack Maternal Grandfather   . Breast cancer Neg Hx     Current Outpatient Medications on File Prior to Visit  Medication Sig Dispense Refill  .  albuterol (PROVENTIL HFA;VENTOLIN HFA) 108 (90 Base) MCG/ACT inhaler Inhale 1 puff into the lungs daily as needed for shortness of breath.    . ALPRAZolam (XANAX) 0.5 MG tablet Take 0.5 mg by mouth at bedtime as needed for anxiety.    . Ascorbic Acid (VITAMIN C) 1000 MG tablet Take 1,000 mg by mouth daily.    Marland Kitchen atorvastatin (LIPITOR) 40 MG tablet Take 40 mg by mouth daily.  1  . Calcium Carbonate-Vitamin D (CALCIUM + D PO) Take 2 tablets by mouth daily.     Noelle Penner FIBER SUPPLEMENT PO Take 2 tablets by mouth daily.    . famotidine (PEPCID) 20 MG tablet Take 1 tablet (20 mg total) by mouth 2 (two) times daily. (Patient not taking: Reported on 08/07/2018) 10 tablet 0  . fluticasone (FLONASE) 50 MCG/ACT nasal spray Place 1 spray into both nostrils daily as needed for allergies.  0  . montelukast (SINGULAIR) 10 MG tablet Take 10 mg by mouth at bedtime.    . Multiple Vitamin (MULTIVITAMIN) tablet Take 1 tablet by mouth daily.    Marland Kitchen omeprazole (PRILOSEC) 40 MG capsule Take 40 mg by mouth daily.    . pioglitazone (ACTOS) 15 MG tablet Take 15 mg by mouth daily.    . Probiotic Product (PROBIOTIC PO) Take by mouth.    . ramipril (ALTACE) 10 MG capsule Take 10 mg by mouth daily.    Marland Kitchen VITAMIN D PO Take by mouth.     No current facility-administered medications on file prior to visit.     Cardiovascular studies:  EKG 06/19/2019: Sinus rhythm 73 bpm. Low voltage in precordial leads.  Poor R-wave progression.  Recent labs: 06/03/2019: H/H 13/39. MCV 93. Platelets 242.  Glucose 111. BUN/Cr 19/0.74. eGFR >90. Na/K 141/4.5.  Chol 159, TG 111, HDL 54, LDL 98. HbA1C 6.5%   Review of Systems  Constitution: Negative for decreased appetite, malaise/fatigue, weight gain and weight loss.  HENT: Negative for congestion.   Eyes: Negative for visual disturbance.  Cardiovascular: Positive for palpitations. Negative for chest pain, dyspnea on exertion, leg swelling and syncope.  Respiratory: Negative for cough.    Endocrine: Negative for cold intolerance.  Hematologic/Lymphatic: Does not bruise/bleed easily.  Skin: Negative for itching and rash.  Musculoskeletal: Negative for myalgias.  Gastrointestinal: Negative for abdominal pain, nausea and vomiting.  Genitourinary: Negative for dysuria.  Neurological: Negative for dizziness and weakness.  Psychiatric/Behavioral: The patient is not nervous/anxious.   All other  systems reviewed and are negative.        Vitals:   06/19/19 1256  BP: 127/64  Pulse: 80  Temp: 97.8 F (36.6 C)  SpO2: 99%     Body mass index is 64.88 kg/m. Filed Weights   06/19/19 1256  Weight: (!) 378 lb (171.5 kg)     Objective:   Physical Exam  Constitutional: She is oriented to person, place, and time. She appears well-developed and well-nourished. No distress.  Morbidly obese  HENT:  Head: Normocephalic and atraumatic.  Eyes: Pupils are equal, round, and reactive to light. Conjunctivae are normal.  Neck: No JVD present.  Cardiovascular: Normal rate, regular rhythm and intact distal pulses.  Pulmonary/Chest: Effort normal and breath sounds normal. She has no wheezes. She has no rales.  Abdominal: Soft. Bowel sounds are normal. There is no rebound.  Musculoskeletal:        General: No edema.  Lymphadenopathy:    She has no cervical adenopathy.  Neurological: She is alert and oriented to person, place, and time. No cranial nerve deficit.  Skin: Skin is warm and dry.  Psychiatric: She has a normal mood and affect.  Nursing note and vitals reviewed.         Assessment & Recommendations:   53 y/o Caucasian female with hypertension, hyperlipidemia, type 2 DM, morbid obesity BMI>60, referred for evaluation of palpitations.   Palpitations: Episodes suspicious for atrial fibrillation or reentrant tachyarrhythmias.  Recommend 4-week event monitor and echocardiogram.  Current episodes of chest pain during tachycardia are likely due to supply demand mismatch.   Recommend exercise treadmill stress test.  Finally, morbid obesity is likely contributing to her comorbidities, including possibility of atrial fibrillation.  I do long discussion with the patient regarding diet and lifestyle modifications, especially calorie negative diet and regular physical activity.  I encouraged her to reestablish discussion regarding weight loss surgery.   No medications added at this time.  Further recommendations after above testing.   Thank you for referring the patient to [place]. Please feel free to contact with any questions.  Nigel Mormon, MD [facility] [place] Pager: [phone] Office: [phone] If no answer Cell [phone]

## 2019-07-09 ENCOUNTER — Ambulatory Visit (INDEPENDENT_AMBULATORY_CARE_PROVIDER_SITE_OTHER): Payer: BC Managed Care – PPO

## 2019-07-09 ENCOUNTER — Other Ambulatory Visit: Payer: Self-pay

## 2019-07-09 ENCOUNTER — Ambulatory Visit: Payer: BC Managed Care – PPO

## 2019-07-09 DIAGNOSIS — R002 Palpitations: Secondary | ICD-10-CM

## 2019-07-09 DIAGNOSIS — R0789 Other chest pain: Secondary | ICD-10-CM

## 2019-07-10 ENCOUNTER — Other Ambulatory Visit: Payer: Self-pay | Admitting: Family Medicine

## 2019-07-10 DIAGNOSIS — Z1231 Encounter for screening mammogram for malignant neoplasm of breast: Secondary | ICD-10-CM

## 2019-07-10 NOTE — Progress Notes (Signed)
Gave patient results. She verbalized understanding.

## 2019-07-14 NOTE — Progress Notes (Signed)
LVM with details.

## 2019-07-30 ENCOUNTER — Telehealth: Payer: BC Managed Care – PPO | Admitting: Cardiology

## 2019-07-30 ENCOUNTER — Other Ambulatory Visit: Payer: Self-pay

## 2019-07-30 ENCOUNTER — Encounter: Payer: Self-pay | Admitting: Cardiology

## 2019-07-30 VITALS — Ht 63.0 in | Wt 372.0 lb

## 2019-07-30 DIAGNOSIS — R0789 Other chest pain: Secondary | ICD-10-CM

## 2019-07-30 DIAGNOSIS — E782 Mixed hyperlipidemia: Secondary | ICD-10-CM

## 2019-07-30 DIAGNOSIS — R002 Palpitations: Secondary | ICD-10-CM | POA: Diagnosis not present

## 2019-07-30 DIAGNOSIS — I4729 Other ventricular tachycardia: Secondary | ICD-10-CM

## 2019-07-30 DIAGNOSIS — I472 Ventricular tachycardia: Secondary | ICD-10-CM

## 2019-07-30 MED ORDER — METOPROLOL TARTRATE 25 MG PO TABS: 12.5000 mg | ORAL_TABLET | Freq: Two times a day (BID) | ORAL | 3 refills | Status: DC

## 2019-07-30 NOTE — Progress Notes (Addendum)
Patient referred by Nickola Major, MD for palpitations  Subjective:   Paula Campos, female    DOB: 1966-08-07, 53 y.o.   MRN: [id number]   Chief Complaint  Patient presents with  . Palpitations  . Follow-up    6 week  . Results    echo/gxt/event   I connected with the patient on 07/30/2019 by a video enabled telemedicine application and verified that I am speaking with the correct person using two identifiers.     I discussed the limitations of evaluation and management by telemedicine and the availability of in person appointments. The patient expressed understanding and agreed to proceed.   This visit type was conducted due to national recommendations for restrictions regarding the COVID-19 Pandemic (e.g. social distancing).  This format is felt to be most appropriate for this patient at this time.  All issues noted in this document were discussed and addressed.  No physical exam was performed (except for noted visual exam findings with Tele health visits).  The patient has consented to conduct a Tele health visit and understands insurance will be billed.   HPI  53 y/o Caucasian female with hypertension, hyperlipidemia, type 2 DM, morbid obesity BMI>60, referred for evaluation of palpitations.   Exercise treadmill stress test showed low exercise capacity, without any EKG changes suggesting ischemia.  Echocardiogram showed mild LVH, normal systolic and diastolic function, mild mitral and tricuspid regurgitation.  Patient is currently wearing event monitor.  She had a 4 beat nonsustained VT episode at 8: 17 a.m. ET on 07/27/2019.  There are no other arrhythmias noted thus far.Paitent is seeing [facility] weight loss clinic.   Past Medical History:  Diagnosis Date  . Anxiety   . Asthma    Broncitis-"Bronchial asthma"-only uses singulair-no Inhalers required.  . Complication of anesthesia    slow  for epidural to wear off after c section  . Diabetes mellitus without complication  ([place])    type 2  . Elevated cholesterol   . GERD (gastroesophageal reflux disease)   . History of hiatal hernia   . History of kidney stones   . Hypertension   . Pneumonia   . Thyroid nodule      Past Surgical History:  Procedure Laterality Date  . Broken ankle Right 2008  . CESAREAN SECTION  24 1/2 yrs ago   x 1  . COLONOSCOPY WITH PROPOFOL N/A 12/07/2015   Procedure: COLONOSCOPY WITH PROPOFOL;  Surgeon: Juanita Craver, MD;  Location: [facility];  Service: Endoscopy;  Laterality: N/A;  . lithotrispy  2011 or 2012     Social History   Socioeconomic History  . Marital status: Divorced    Spouse name: Not on file  . Number of children: 1  . Years of education: Not on file  . Highest education level: Not on file  Occupational History  . Not on file  Social Needs  . Financial resource strain: Not on file  . Food insecurity    Worry: Not on file    Inability: Not on file  . Transportation needs    Medical: Not on file    Non-medical: Not on file  Tobacco Use  . Smoking status: Never Smoker  . Smokeless tobacco: Never Used  Substance and Sexual Activity  . Alcohol use: Yes    Alcohol/week: 0.0 standard drinks    Comment: Rare  . Drug use: No  . Sexual activity: Not Currently    Birth control/protection: Other-see comments  Comment: Vasectomy-1st intercourse 50 yo-5 partners  Lifestyle  . Physical activity    Days per week: Not on file    Minutes per session: Not on file  . Stress: Not on file  Relationships  . Social Herbalist on phone: Not on file    Gets together: Not on file    Attends religious service: Not on file    Active member of club or organization: Not on file    Attends meetings of clubs or organizations: Not on file    Relationship status: Not on file  . Intimate partner violence    Fear of current or ex partner: Not on file    Emotionally abused: Not on file    Physically abused: Not on file    Forced sexual activity: Not on  file  Other Topics Concern  . Not on file  Social History Narrative  . Not on file    Family History  Problem Relation Age of Onset  . Cancer Maternal Grandmother        Abdominal  . Heart attack Maternal Grandfather   . Hyperlipidemia Mother   . Breast cancer Neg Hx     Current Outpatient Medications on File Prior to Visit  Medication Sig Dispense Refill  . albuterol (PROVENTIL HFA;VENTOLIN HFA) 108 (90 Base) MCG/ACT inhaler Inhale 1 puff into the lungs daily as needed for shortness of breath.    . ALPRAZolam (XANAX) 0.5 MG tablet Take 0.5 mg by mouth at bedtime as needed for anxiety.    . Ascorbic Acid (VITAMIN C) 1000 MG tablet Take 1,000 mg by mouth daily.    Marland Kitchen aspirin EC 81 MG tablet Take 81 mg by mouth daily.    Marland Kitchen atorvastatin (LIPITOR) 40 MG tablet Take 40 mg by mouth daily.  1  . b complex vitamins capsule Take 1 capsule by mouth daily.    . Calcium Carbonate-Vitamin D (CALCIUM + D PO) Take 2 tablets by mouth daily.     Noelle Penner FIBER SUPPLEMENT PO Take 2 tablets by mouth daily.    . famotidine (PEPCID) 20 MG tablet Take 1 tablet (20 mg total) by mouth 2 (two) times daily. 10 tablet 0  . fluticasone (FLONASE) 50 MCG/ACT nasal spray Place 1 spray into both nostrils daily as needed for allergies.  0  . Magnesium 250 MG TABS Take 1 tablet by mouth daily.    . montelukast (SINGULAIR) 10 MG tablet Take 10 mg by mouth at bedtime.    . Multiple Vitamin (MULTIVITAMIN) tablet Take 1 tablet by mouth daily.    . naproxen sodium (ALEVE) 220 MG tablet Take 1 tablet by mouth daily.    Marland Kitchen omeprazole (PRILOSEC) 40 MG capsule Take 40 mg by mouth daily.    . pioglitazone (ACTOS) 15 MG tablet Take 15 mg by mouth daily.    . Probiotic Product (PROBIOTIC PO) Take by mouth.    . ramipril (ALTACE) 10 MG capsule Take 10 mg by mouth daily.    Marland Kitchen VITAMIN D PO Take by mouth.     No current facility-administered medications on file prior to visit.     Cardiovascular studies:  Echocardiogram  07/09/2019: Left ventricle cavity is normal in size. Mild concentric hypertrophy of the left ventricle. Normal LV systolic function with EF 55%. Normal global wall motion. Normal diastolic filling pattern.  Left atrial cavity is mildly dilated. Mild (Grade I) mitral regurgitation. Mild tricuspid regurgitation. Estimated pulmonary artery systolic pressure is 23  mmHg.  Treadmill Exercise Stress Test 07/09/2019: The patient exercised for 3:01 minutes on Bruce protocol; achieved 4.66 METs at 87% of maximum predicted heart rate. The baseline blood pressure was 154/92 mmHg and increased to 177/75 mmHg at peak exercise. Exercise capacity severely reduced. Chest pain is not present. Exercise was terminated due to fatigue/weakness. Resting EKG normal sinus rhythm, stress EKG negative for myocardial ischemia.  No significant arrhythmias.  EKG 06/19/2019: Sinus rhythm 73 bpm. Low voltage in precordial leads.  Poor R-wave progression.  Recent labs: 06/03/2019: H/H 13/39. MCV 93. Platelets 242.  Glucose 111. BUN/Cr 19/0.74. eGFR >90. Na/K 141/4.5.  Chol 159, TG 111, HDL 54, LDL 98. HbA1C 6.5%   Review of Systems  Constitution: Negative for decreased appetite, malaise/fatigue, weight gain and weight loss.  HENT: Negative for congestion.   Eyes: Negative for visual disturbance.  Cardiovascular: Positive for palpitations. Negative for chest pain, dyspnea on exertion, leg swelling and syncope.  Respiratory: Negative for cough.   Endocrine: Negative for cold intolerance.  Hematologic/Lymphatic: Does not bruise/bleed easily.  Skin: Negative for itching and rash.  Musculoskeletal: Negative for myalgias.  Gastrointestinal: Negative for abdominal pain, nausea and vomiting.  Genitourinary: Negative for dysuria.  Neurological: Negative for dizziness and weakness.  Psychiatric/Behavioral: The patient is not nervous/anxious.   All other systems reviewed and are negative.       Vitals not available.    Objective:   Physical Exam  Constitutional: She is oriented to person, place, and time. She appears well-developed and well-nourished. No distress.  Pulmonary/Chest: Effort normal.  Neurological: She is alert and oriented to person, place, and time.  Psychiatric: She has a normal mood and affect.  Nursing note and vitals reviewed.         Assessment & Recommendations:   52 y/o Caucasian female with hypertension, hyperlipidemia, type 2 DM, morbid obesity BMI>60, referred for evaluation of palpitations.   Palpitations: Low exercise capacity no ischemic changes on treadmill stress test.  Structurally normal heart on echocardiogram.  4 beat nonsustained ventricular tachycardia seen in early morning hours.  Recommend metoprolol tartrate 12.5 mg once daily.  Recommend evaluation for Obstructive sleep apnea.  Continue follow-up with [facility] weight loss clinic.  Follow-up in 3 months.   Nigel Mormon, MD [facility] [place] Pager: [phone] Office: [phone] If no answer Cell [phone]

## 2019-08-04 ENCOUNTER — Telehealth: Payer: Self-pay

## 2019-08-04 NOTE — Telephone Encounter (Signed)
Telephone encounter:  Reason for call: Patient called and asked you to fax a clearance letter to [facility] [phone] Dr. Lucien Mons .   She alos does not want to start the Metoprolol you prescribed for her. She is going to have a sleep study soon as well.   Usual provider: MP  Last office visit: 07/30/19 Virtual  Next office visit: 10/29/09   Last hospitalization: NA  Current Outpatient Medications on File Prior to Visit  Medication Sig Dispense Refill  . albuterol (PROVENTIL HFA;VENTOLIN HFA) 108 (90 Base) MCG/ACT inhaler Inhale 1 puff into the lungs daily as needed for shortness of breath.    . ALPRAZolam (XANAX) 0.5 MG tablet Take 0.5 mg by mouth at bedtime as needed for anxiety.    . Ascorbic Acid (VITAMIN C) 1000 MG tablet Take 1,000 mg by mouth daily.    Marland Kitchen aspirin EC 81 MG tablet Take 81 mg by mouth daily.    Marland Kitchen atorvastatin (LIPITOR) 40 MG tablet Take 40 mg by mouth daily.  1  . b complex vitamins capsule Take 1 capsule by mouth daily.    . Calcium Carbonate-Vitamin D (CALCIUM + D PO) Take 2 tablets by mouth daily.     Noelle Penner FIBER SUPPLEMENT PO Take 2 tablets by mouth daily.    . fluticasone (FLONASE) 50 MCG/ACT nasal spray Place 1 spray into both nostrils daily as needed for allergies.  0  . Magnesium 250 MG TABS Take 1 tablet by mouth daily.    . metoprolol tartrate (LOPRESSOR) 25 MG tablet Take 0.5 tablets (12.5 mg total) by mouth 2 (two) times daily. 30 tablet 3  . montelukast (SINGULAIR) 10 MG tablet Take 10 mg by mouth at bedtime.    . Multiple Vitamin (MULTIVITAMIN) tablet Take 1 tablet by mouth daily.    . naproxen sodium (ALEVE) 220 MG tablet Take 1 tablet by mouth daily.    Marland Kitchen omeprazole (PRILOSEC) 40 MG capsule Take 40 mg by mouth daily.    . pioglitazone (ACTOS) 15 MG tablet Take 15 mg by mouth daily.    . Probiotic Product (PROBIOTIC PO) Take by mouth.    . ramipril (ALTACE) 10 MG capsule Take 10 mg by mouth daily.    Marland Kitchen VITAMIN D PO Take by mouth.      No current facility-administered medications on file prior to visit.

## 2019-08-05 NOTE — Telephone Encounter (Signed)
ok 

## 2019-08-05 NOTE — Telephone Encounter (Signed)
Patient will get [place] more info once she has surgical consult.

## 2019-08-06 ENCOUNTER — Other Ambulatory Visit: Payer: Self-pay

## 2019-08-06 ENCOUNTER — Ambulatory Visit: Payer: BC Managed Care – PPO | Admitting: Neurology

## 2019-08-06 ENCOUNTER — Encounter: Payer: Self-pay | Admitting: Neurology

## 2019-08-06 VITALS — BP 137/74 | HR 65 | Temp 97.7°F | Ht 63.5 in | Wt 374.0 lb

## 2019-08-06 DIAGNOSIS — Z6841 Body Mass Index (BMI) 40.0 and over, adult: Secondary | ICD-10-CM

## 2019-08-06 DIAGNOSIS — R002 Palpitations: Secondary | ICD-10-CM

## 2019-08-06 DIAGNOSIS — R0683 Snoring: Secondary | ICD-10-CM | POA: Diagnosis not present

## 2019-08-06 DIAGNOSIS — R351 Nocturia: Secondary | ICD-10-CM

## 2019-08-06 DIAGNOSIS — Z9189 Other specified personal risk factors, not elsewhere classified: Secondary | ICD-10-CM

## 2019-08-06 DIAGNOSIS — I472 Ventricular tachycardia: Secondary | ICD-10-CM

## 2019-08-06 DIAGNOSIS — R519 Headache, unspecified: Secondary | ICD-10-CM

## 2019-08-06 DIAGNOSIS — I4729 Other ventricular tachycardia: Secondary | ICD-10-CM

## 2019-08-06 NOTE — Progress Notes (Signed)
Subjective:    Patient ID: Paula Campos is a 53 y.o. female.  HPI     Star Age, MD, PhD [facility] [address] [place], [place] [address]  Dear Dr. Virgina Jock,   I saw your patient, Paula Campos, upon your kind request to my sleep clinic today for initial consultation of her sleep disorder, in particular, concern for underlying obstructive sleep apnea.  Patient is unaccompanied today.  As you know, Paula Campos is a 53 year old right-handed woman with an underlying medical history of NSVT, anxiety, asthma, diabetes, hyperlipidemia, reflux disease, history of kidney stones, hypertension, thyroid nodule, and morbid obesity with a BMI of over 60, who reports snoring and nocturnal palpitations, some sleep disruption.  She reports that sometimes she has a hard time going to sleep or staying asleep as she has a difficult time shutting down.  She likes to read before she falls asleep.  Generally, she is in bed around 10 and typically asleep before 11.  She does not typically watch TV in her bedroom.  Rise time is between 6:40 AM and 7:30 AM.  She is currently working from home, she works for a small call center.  She is finishing up her 30-day Holter monitor this week.  She wakes up with allergy symptoms and cough and some reflux symptoms at night.  She is on a PPI.  She has 2 dogs in the household, her 68 year old son lives with her, she is divorced.  She has no other children.  She is in weight management through [facility].  She has had occasional morning headaches, generally gets up to use the bathroom once and there morning around 5 and then goes back to bed.  She reports that she snores at times but not always.  I reviewed your telemedicine note from July 30, 2019.  Her Epworth sleepiness score is 2 out of 24, fatigue severity score is 22 out of 63.  She is a non-smoker and drinks caffeine and limitation, 1 cup/day, rare alcohol.   Her Past Medical History Is  Significant For: Past Medical History:  Diagnosis Date  . Anxiety   . Asthma    Broncitis-"Bronchial asthma"-only uses singulair-no Inhalers required.  . Complication of anesthesia    slow  for epidural to wear off after c section  . Diabetes mellitus without complication ([place])    type 2  . Elevated cholesterol   . GERD (gastroesophageal reflux disease)   . History of hiatal hernia   . History of kidney stones   . Hypertension   . Pneumonia   . Thyroid nodule     Her Past Surgical History Is Significant For: Past Surgical History:  Procedure Laterality Date  . Broken ankle Right 2008  . CESAREAN SECTION  24 1/2 yrs ago   x 1  . COLONOSCOPY WITH PROPOFOL N/A 12/07/2015   Procedure: COLONOSCOPY WITH PROPOFOL;  Surgeon: Juanita Craver, MD;  Location: [facility];  Service: Endoscopy;  Laterality: N/A;  . lithotrispy  2011 or 2012    Her Family History Is Significant For: Family History  Problem Relation Age of Onset  . Cancer Maternal Grandmother        Abdominal  . Heart attack Maternal Grandfather   . Hyperlipidemia Mother   . Breast cancer Neg Hx     Her Social History Is Significant For: Social History   Socioeconomic History  . Marital status: Divorced    Spouse name: Not on file  .  Number of children: 1  . Years of education: Not on file  . Highest education level: Not on file  Occupational History  . Not on file  Social Needs  . Financial resource strain: Not on file  . Food insecurity    Worry: Not on file    Inability: Not on file  . Transportation needs    Medical: Not on file    Non-medical: Not on file  Tobacco Use  . Smoking status: Never Smoker  . Smokeless tobacco: Never Used  Substance and Sexual Activity  . Alcohol use: Yes    Alcohol/week: 0.0 standard drinks    Comment: Rare  . Drug use: No  . Sexual activity: Not Currently    Birth control/protection: Other-see comments    Comment: Vasectomy-1st intercourse 89 yo-5 partners   Lifestyle  . Physical activity    Days per week: Not on file    Minutes per session: Not on file  . Stress: Not on file  Relationships  . Social Musicianconnections    Talks on phone: Not on file    Gets together: Not on file    Attends religious service: Not on file    Active member of club or organization: Not on file    Attends meetings of clubs or organizations: Not on file    Relationship status: Not on file  Other Topics Concern  . Not on file  Social History Narrative  . Not on file    Her Allergies Are:  Allergies  Allergen Reactions  . Dulaglutide Nausea And Vomiting    Bloating, gas  . Sulfa Antibiotics Dermatitis  . Adhesive [Tape] Rash  . Amoxicillin Rash    Has patient had a PCN reaction causing immediate rash, facial/tongue/throat swelling, SOB or lightheadedness with hypotension: No Has patient had a PCN reaction causing severe rash involving mucus membranes or skin necrosis: No Has patient had a PCN reaction that required hospitalization No Has patient had a PCN reaction occurring within the last 10 years: No If all of the above answers are "NO", then may proceed with Cephalosporin use.  :   Her Current Medications Are:  Outpatient Encounter Medications as of 08/06/2019  Medication Sig  . albuterol (PROVENTIL HFA;VENTOLIN HFA) 108 (90 Base) MCG/ACT inhaler Inhale 1 puff into the lungs daily as needed for shortness of breath.  . ALPRAZolam (XANAX) 0.5 MG tablet Take 0.5 mg by mouth at bedtime as needed for anxiety.  . Ascorbic Acid (VITAMIN C) 1000 MG tablet Take 1,000 mg by mouth daily.  Marland Kitchen. aspirin EC 81 MG tablet Take 81 mg by mouth daily.  Marland Kitchen. atorvastatin (LIPITOR) 40 MG tablet Take 40 mg by mouth daily.  Marland Kitchen. b complex vitamins capsule Take 1 capsule by mouth daily.  . Calcium Carbonate-Vitamin D (CALCIUM + D PO) Take 2 tablets by mouth daily.   Burman Blacksmith. EQ FIBER SUPPLEMENT PO Take 2 tablets by mouth daily.  . fluticasone (FLONASE) 50 MCG/ACT nasal spray Place 1 spray  into both nostrils daily as needed for allergies.  . Magnesium 250 MG TABS Take 1 tablet by mouth daily.  . montelukast (SINGULAIR) 10 MG tablet Take 10 mg by mouth at bedtime.  . Multiple Vitamin (MULTIVITAMIN) tablet Take 1 tablet by mouth daily.  . naproxen sodium (ALEVE) 220 MG tablet Take 1 tablet by mouth daily.  Marland Kitchen. omeprazole (PRILOSEC) 40 MG capsule Take 40 mg by mouth daily.  . pioglitazone (ACTOS) 15 MG tablet Take 15 mg by mouth daily.  .Marland Kitchen  Probiotic Product (PROBIOTIC PO) Take by mouth.  . ramipril (ALTACE) 10 MG capsule Take 10 mg by mouth daily.  Marland Kitchen VITAMIN D PO Take by mouth.  . metoprolol tartrate (LOPRESSOR) 25 MG tablet Take 0.5 tablets (12.5 mg total) by mouth 2 (two) times daily. (Patient not taking: Reported on 08/06/2019)   No facility-administered encounter medications on file as of 08/06/2019.   :  Review of Systems:  Out of a complete 14 point review of systems, all are reviewed and negative with the exception of these symptoms as listed below: Review of Systems  Neurological:       Pt presents today to discuss her sleep. Pt has never had a sleep study and does not endorse snoring.  Epworth Sleepiness Scale 0= would never doze 1= slight chance of dozing 2= moderate chance of dozing 3= high chance of dozing  Sitting and reading: 0 Watching TV: 0 Sitting inactive in a public place (ex. Theater or meeting): 0 As a passenger in a car for an hour without a break: 0 Lying down to rest in the afternoon: 2 Sitting and talking to someone: 0 Sitting quietly after lunch (no alcohol): 0 In a car, while stopped in traffic: 0 Total: 2    Objective:  Neurological Exam  Physical Exam Physical Examination:   Vitals:   08/06/19 1617  BP: 137/74  Pulse: 65  Temp: 97.7 F (36.5 C)   General Examination: The patient is a very pleasant 53 y.o. female in no acute distress. She appears well-developed and well-nourished and well groomed.   HEENT: Normocephalic,  atraumatic, pupils are equal, round and reactive to light, extraocular tracking is good without limitation to gaze excursion or nystagmus noted. Hearing is grossly intact. Face is symmetric with normal facial animation. Speech is clear with no dysarthria noted. There is no hypophonia. There is no lip, neck/head, jaw or voice tremor. Neck is supple with full range of passive and active motion. There are no carotid bruits on auscultation. Oropharynx exam reveals: mild mouth dryness, adequate dental hygiene and moderate airway crowding, due to Smaller airway entry, tonsils of 1-2+, Mallampati class II, slightly larger uvula, tongue protrudes centrally and palate elevates symmetrically, neck circumference is 16-7/8 inches.  She has a mild to moderate overbite.  Chest: Clear to auscultation without wheezing, rhonchi or crackles noted.  Heart: S1+S2+0, regular and normal without murmurs, rubs or gallops noted.   Abdomen: Soft, non-tender and non-distended with normal bowel sounds appreciated on auscultation.  Extremities: There is trace pitting edema in the distal lower extremities bilaterally.   Skin: Warm and dry without trophic changes noted.   Musculoskeletal: exam reveals no obvious joint deformities, tenderness or joint swelling or erythema. Hardware R ankle.  Neurologically:  Mental status: The patient is awake, alert and oriented in all 4 spheres. Her immediate and remote memory, attention, language skills and fund of knowledge are appropriate. There is no evidence of aphasia, agnosia, apraxia or anomia. Speech is clear with normal prosody and enunciation. Thought process is linear. Mood is normal and affect is normal.  Cranial nerves II - XII are as described above under HEENT exam.  Motor exam: Normal bulk, strength and tone is noted. There is no tremor, Romberg is negative. Reflexes are 1+ throughout. Fine motor skills and coordination: grossly intact.  Cerebellar testing: No dysmetria or  intention tremor. There is no truncal or gait ataxia.  Sensory exam: intact to light touch in the upper and lower extremities.  Gait, station  and balance: She stands easily. No veering to one side is noted. No leaning to one side is noted. Posture is age-appropriate and stance is narrow based. Gait shows normal stride length and normal pace. No problems turning are noted.                 Assessment and Plan:   In summary, Sonny L Deike is a very pleasant 53 y.o.-year old female with an underlying medical history of NSVT, anxiety, asthma, diabetes, hyperlipidemia, reflux disease, history of kidney stones, hypertension, thyroid nodule, and morbid obesity with a BMI of over 60, whose history and physical exam are concerning for obstructive sleep apnea (OSA). I had a long chat with the patient about my findings and the diagnosis of OSA, its prognosis and treatment options. We talked about medical treatments, surgical interventions and non-pharmacological approaches. I explained in particular the risks and ramifications of untreated moderate to severe OSA, especially with respect to developing cardiovascular disease down the Road, including congestive heart failure, difficult to treat hypertension, cardiac arrhythmias, or stroke. Even type 2 diabetes has, in part, been linked to untreated OSA. Symptoms of untreated OSA include daytime sleepiness, memory problems, mood irritability and mood disorder such as depression and anxiety, lack of energy, as well as recurrent headaches, especially morning headaches. We talked about trying to maintain a healthy lifestyle in general, as well as the importance of weight control. We also talked about the importance of good sleep hygiene. I recommended the following at this time: sleep study.  I explained the sleep test procedure to the patient and also outlined possible surgical and non-surgical treatment options of OSA, including the use of a custom-made dental device (which  would require a referral to a specialist dentist or oral surgeon), upper airway surgical options, such as traditional UPPP or a novel less invasive surgical option in the form of Inspire hypoglossal nerve stimulation (which would involve a referral to an ENT surgeon). I also explained the CPAP treatment option to the patient, who indicated that she would be willing to try CPAP if the need arises. I explained the importance of being compliant with PAP treatment, not only for insurance purposes but primarily to improve Her symptoms, and for the patient's long term health benefit, including to reduce Her cardiovascular risks. I answered all her questions today and the patient was in agreement. I plan to see her back after the sleep study is completed and encouraged her to call with any interim questions, concerns, problems or updates.   Thank you very much for allowing me to participate in the care of this nice patient. If I can be of any further assistance to you please do not hesitate to call me at [phone].  Sincerely,   Huston Foley, MD, PhD

## 2019-08-06 NOTE — Patient Instructions (Signed)

## 2019-08-08 ENCOUNTER — Other Ambulatory Visit: Payer: Self-pay

## 2019-08-11 ENCOUNTER — Other Ambulatory Visit: Payer: Self-pay

## 2019-08-11 ENCOUNTER — Ambulatory Visit (INDEPENDENT_AMBULATORY_CARE_PROVIDER_SITE_OTHER): Payer: BC Managed Care – PPO | Admitting: Gynecology

## 2019-08-11 ENCOUNTER — Telehealth: Payer: Self-pay | Admitting: Cardiology

## 2019-08-11 ENCOUNTER — Encounter: Payer: Self-pay | Admitting: Gynecology

## 2019-08-11 ENCOUNTER — Ambulatory Visit
Admission: RE | Admit: 2019-08-11 | Discharge: 2019-08-11 | Disposition: A | Discharge status: Home / Self Care | Payer: BC Managed Care – PPO | Source: Ambulatory Visit | Attending: Family Medicine | Admitting: Family Medicine

## 2019-08-11 VITALS — BP 124/84 | Ht 64.0 in | Wt 370.0 lb

## 2019-08-11 DIAGNOSIS — N951 Menopausal and female climacteric states: Secondary | ICD-10-CM

## 2019-08-11 DIAGNOSIS — R002 Palpitations: Secondary | ICD-10-CM

## 2019-08-11 DIAGNOSIS — I480 Paroxysmal atrial fibrillation: Secondary | ICD-10-CM

## 2019-08-11 DIAGNOSIS — Z01419 Encounter for gynecological examination (general) (routine) without abnormal findings: Secondary | ICD-10-CM

## 2019-08-11 DIAGNOSIS — I472 Ventricular tachycardia: Secondary | ICD-10-CM

## 2019-08-11 DIAGNOSIS — Z1231 Encounter for screening mammogram for malignant neoplasm of breast: Secondary | ICD-10-CM | POA: Diagnosis not present

## 2019-08-11 DIAGNOSIS — I4729 Other ventricular tachycardia: Secondary | ICD-10-CM

## 2019-08-11 MED ORDER — METOPROLOL TARTRATE 25 MG PO TABS: 25.0000 mg | ORAL_TABLET | Freq: Two times a day (BID) | ORAL | 3 refills | Status: DC

## 2019-08-11 MED ORDER — APIXABAN 5 MG PO TABS: 5.0000 mg | ORAL_TABLET | Freq: Two times a day (BID) | ORAL | 2 refills | Status: DC

## 2019-08-11 NOTE — Telephone Encounter (Signed)
Discussed event monitor findings with the patient.  On 08/07/2019, patient had atrial fibrillation with sustained RVR.  Patient does remember feeling symptoms of "anxiety", that correlated temporally with this episode.  Mild RVR was 160 bpm, she had brief P waves or aberrant conduction of 190 bpm.  Recommend increasing metoprolol heart rate to 25 mg twice daily.  CHA2DS2-VASc score 3, annual stroke risk 3.2%.  Started Eliquis 5 mg twice daily after discussing risks and benefits.  Recommend stopping aspirin and naproxen to reduce bleeding risk.  If A. fib episodes remain infrequent, rate control therapy is appropriate.  Continue management of obesity and sleep apnea.

## 2019-08-11 NOTE — Patient Instructions (Signed)
Follow-up in 1 year for annual exam, sooner if any issues. 

## 2019-08-11 NOTE — Progress Notes (Signed)
    Paula Campos Feb 02, 1966 [id number]        53 y.o.  G2P0011 for annual gynecologic exam.  Continues to have menses although she is starting to space them out.  Without other significant menopausal symptoms.  Past medical history,surgical history, problem list, medications, allergies, family history and social history were all reviewed and documented as reviewed in the EPIC chart.  ROS:  Performed with pertinent positives and negatives included in the history, assessment and plan.   Additional significant findings : None   Exam: [name] assistant Vitals:   08/11/19 1557  BP: 124/84  Weight: (!) 370 lb (167.8 kg)  Height: 5\' 4"  (1.626 m)   Body mass index is 63.51 kg/m.  General appearance:  Normal affect, orientation and appearance. Skin: Grossly normal HEENT: Without gross lesions.  No cervical or supraclavicular adenopathy. Thyroid normal.  Lungs:  Clear without wheezing, rales or rhonchi Cardiac: RR, without RMG Abdominal:  Soft, nontender, without masses, guarding, rebound, organomegaly or hernia Breasts:  Examined lying and sitting without masses, retractions, discharge or axillary adenopathy. Pelvic:  Ext, BUS, Vagina: Normal.  Left labia minora more prominent than right as always  Cervix: Normal  Uterus: Unable to palpate but no gross masses or tenderness  Adnexa: Without masses or tenderness    Anus and perineum: Normal   Rectovaginal: Normal sphincter tone without palpated masses or tenderness.    Assessment/Plan:  53 y.o. G17P0011 female for annual gynecologic exam.  With irregular menses, vasectomy birth control  1. Perimenopausal.  Patient is starting to space out her menses going up to 2-1/2 months without.  No prolonged or atypical bleeding between.  Not having significant hot flushes or sweats.  Will keep menstrual calendar as long as less frequent but regular when they occur will monitor.  If prolonged or atypical bleeding she will follow-up for further  evaluation. 2. Colonoscopy 2017.  Repeat at their recommended interval. 3. Pap smear/HPV 2019.  No Pap smear done today.  No history of abnormal Pap smears.  Plan repeat Pap smear/HPV at 5-year interval per current screening guidelines. 4. Mammography today.  Breast exam normal today. 5. Health maintenance.  No routine lab work done as patient does this elsewhere.  Follow-up 1 year, sooner as needed.   [name] MD, 4:19 PM 08/11/2019

## 2019-08-12 ENCOUNTER — Telehealth: Payer: Self-pay

## 2019-08-12 NOTE — Telephone Encounter (Signed)
Telephone encounter:  Reason for call: Pt called and wants to know how low should her hr be for her to start to worry with the meds you put her on; Today its in the 40's; Please advise  Usual provider: MP  Last office visit: 08/11/2019  Next office visit: 10/30/2019   Last hospitalization:    Current Outpatient Medications on File Prior to Visit  Medication Sig Dispense Refill  . albuterol (PROVENTIL HFA;VENTOLIN HFA) 108 (90 Base) MCG/ACT inhaler Inhale 1 puff into the lungs daily as needed for shortness of breath.    . ALPRAZolam (XANAX) 0.5 MG tablet Take 0.5 mg by mouth at bedtime as needed for anxiety.    Marland Kitchen apixaban (ELIQUIS) 5 MG TABS tablet Take 1 tablet (5 mg total) by mouth 2 (two) times daily. 60 tablet 2  . Ascorbic Acid (VITAMIN C) 1000 MG tablet Take 1,000 mg by mouth daily.    Marland Kitchen atorvastatin (LIPITOR) 40 MG tablet Take 40 mg by mouth daily.  1  . b complex vitamins capsule Take 1 capsule by mouth daily.    . Calcium Carbonate-Vitamin D (CALCIUM + D PO) Take 2 tablets by mouth daily.     Noelle Penner FIBER SUPPLEMENT PO Take 2 tablets by mouth daily.    . fluticasone (FLONASE) 50 MCG/ACT nasal spray Place 1 spray into both nostrils daily as needed for allergies.  0  . Magnesium 250 MG TABS Take 1 tablet by mouth daily.    . metoprolol tartrate (LOPRESSOR) 25 MG tablet Take 1 tablet (25 mg total) by mouth 2 (two) times daily. 60 tablet 3  . montelukast (SINGULAIR) 10 MG tablet Take 10 mg by mouth at bedtime.    . Multiple Vitamin (MULTIVITAMIN) tablet Take 1 tablet by mouth daily.    Marland Kitchen omeprazole (PRILOSEC) 40 MG capsule Take 40 mg by mouth daily.    . pioglitazone (ACTOS) 15 MG tablet Take 15 mg by mouth daily.    . Probiotic Product (PROBIOTIC PO) Take by mouth.    . ramipril (ALTACE) 10 MG capsule Take 10 mg by mouth daily.    Marland Kitchen VITAMIN D PO Take by mouth.     No current facility-administered medications on file prior to visit.

## 2019-08-12 NOTE — Telephone Encounter (Signed)
I called, went to voicemail. She may take 1/2 pill twice daily.

## 2019-08-13 NOTE — Telephone Encounter (Signed)
Of which medication?

## 2019-08-13 NOTE — Telephone Encounter (Signed)
Pt aware.

## 2019-08-13 NOTE — Telephone Encounter (Signed)
Metoprolol tartarate

## 2019-08-25 ENCOUNTER — Ambulatory Visit: Payer: BC Managed Care – PPO | Admitting: Neurology

## 2019-08-25 DIAGNOSIS — G4733 Obstructive sleep apnea (adult) (pediatric): Secondary | ICD-10-CM

## 2019-08-25 DIAGNOSIS — R0683 Snoring: Secondary | ICD-10-CM

## 2019-08-25 DIAGNOSIS — Z9189 Other specified personal risk factors, not elsewhere classified: Secondary | ICD-10-CM

## 2019-08-25 DIAGNOSIS — I4729 Other ventricular tachycardia: Secondary | ICD-10-CM

## 2019-08-25 DIAGNOSIS — Z6841 Body Mass Index (BMI) 40.0 and over, adult: Secondary | ICD-10-CM

## 2019-08-25 DIAGNOSIS — R519 Headache, unspecified: Secondary | ICD-10-CM

## 2019-08-25 DIAGNOSIS — I472 Ventricular tachycardia: Secondary | ICD-10-CM

## 2019-08-25 DIAGNOSIS — R002 Palpitations: Secondary | ICD-10-CM

## 2019-08-25 DIAGNOSIS — R351 Nocturia: Secondary | ICD-10-CM

## 2019-09-02 ENCOUNTER — Telehealth: Payer: Self-pay

## 2019-09-02 NOTE — Procedures (Signed)
Patient Information     First Name: Paula Last Name: Campos ID: [id number]  Birth Date: 10-Dec-1965 Age: 53 Gender: Female  Referring Provider: Nickola Major, MD BMI: 64.0 (W=374 lb, H=5' 4'')  Neck Circ.:  83 '' Epworth:  2/24   Sleep Study Information    Study Date: Aug 25, 2019 S/H/A Version: [id number] / 4.1.1528 / 25  History:    53 year old woman with a history of NSVT, anxiety, asthma, diabetes, hyperlipidemia, reflux disease, history of kidney stones, hypertension, thyroid nodule, and morbid obesity with a BMI of over 60, who reports snoring, nocturnal palpitations, and sleep disruption.  Summary & Diagnosis:     OSA  Recommendations:      This home sleep test demonstrates severe obstructive sleep apnea with a total AHI of 34.9/hour and O2 nadir of 80%. Treatment with positive airway pressure (in the form of CPAP) is recommended. This will require a full night CPAP titration study for proper treatment settings, O2 monitoring and mask fitting. Based on the severity of the sleep disordered breathing an attended titration study is indicated. However, patient's insurance has denied an attended sleep study; therefore, the patient will be advised to proceed with an autoPAP titration/trial at home for now. Please note that untreated obstructive sleep apnea may carry additional perioperative morbidity. Patients with significant obstructive sleep apnea should receive perioperative PAP therapy and the surgeons and particularly the anesthesiologist should be informed of the diagnosis and the severity of the sleep disordered breathing. The patient should be cautioned not to drive, work at heights, or operate dangerous or heavy equipment when tired or sleepy. Review and reiteration of good sleep hygiene measures should be pursued with any patient. Other causes of the patient's symptoms, including circadian rhythm disturbances, an underlying mood disorder, medication effect and/or an underlying medical  problem cannot be ruled out based on this test. Clinical correlation is recommended. The patient and her referring provider will be notified of the test results. The patient will be seen in follow up in sleep clinic at [facility].  I certify that I have reviewed the raw data recording prior to the issuance of this report in accordance with the standards of the American Academy of Sleep Medicine (AASM).  Star Age, MD, PhD [facility]) Diplomat, ABPN (Neurology and Sleep)            Sleep Summary    Oxygen Saturation Statistics     Start Study Time: End Study Time: Total Recording Time:  9:36:26 PM 7:40:27 AM 10 h, 4 min  Total Sleep Time % REM of Sleep Time:  7 h, 31 min  21.0    Mean: 94 Minimum: 80 Maximum: 99  Mean of Desaturations Nadirs (%):   91  Oxygen Desaturation. %:   4-9 10-20 >20 Total  Events Number Total   111  13 89.5 10.5  0 0.0  124 100.0  Oxygen Saturation: <90 <=88 <85 <80 <70  Duration (minutes): Sleep % 13.7 3.0  7.9 0.3  1.7 0.1 0.0 0.0 0.0 0.0     Respiratory Indices      Total Events REM NREM All Night  pRDI:  283  pAHI:  235 ODI:  124  pAHIc:  1  % CSR: 0.0 67.2 65.9 45.1 0.0 35.2 26.5 11.2 0.2 42.1 34.9 18.4 0.2       Pulse Rate Statistics during Sleep (BPM)      Mean:  47 Minimum: N/A Maximum: 78  Indices are calculated using technically valid sleep time of  6 h, 43 min. pRDI/pAHI are calculated using oxi desaturations ? 3%  Body Position Statistics  Position Supine Prone Right Left Non-Supine  Sleep (min) 412.0 0.0 36.5 2.5 39.0  Sleep % 91.4 0.0 8.1 0.6 8.6  pRDI 41.1 N/A 49.3 N/A 52.1  pAHI 36.6 N/A 13.6 N/A 17.9  ODI 18.7 N/A 10.2 N/A 16.3     Snoring Statistics Snoring Level (dB) >40 >50 >60 >70 >80 >Threshold (45)  Sleep (min) 405.3 105.0 15.8 0.0 0.0 176.0  Sleep % 89.9 23.3 3.5 0.0 0.0 39.0    Mean: 46 dB Sleep Stages Chart                                                      pAHI=34.9                                                                 Mild              Moderate                    Severe                                                 5              15                    30

## 2019-09-02 NOTE — Telephone Encounter (Signed)
-----   Message from Star Age, MD sent at 09/02/2019  7:51 AM EST ----- Patient referred by Dr. Virgina Jock, seen by me on 08/06/19, HST on 08/25/19.    Please call and notify the patient that the recent home sleep test showed obstructive sleep apnea in the severe range. While I recommend treatment for this in the form CPAP, her insurance has not approved a sleep study in the lab for this. They will likely only approve a trial of autoPAP, which means, that we don't have to bring her in for a sleep study with CPAP, but will let her try an autoPAP machine at home, through a DME company (of her choice, or as per insurance requirement). The DME representative will educate her on how to use the machine, how to put the mask on, etc. I have placed an order in the chart. Please send referral, talk to patient, send report to referring MD. We will need a FU in sleep clinic for 10 weeks post-PAP set up, please arrange that with me or one of our NPs. Thanks,   Star Age, MD, PhD [facility])

## 2019-09-02 NOTE — Addendum Note (Signed)
Addended by: Star Age on: 09/02/2019 07:51 AM   Modules accepted: Orders

## 2019-09-02 NOTE — Progress Notes (Signed)
Patient referred by Dr. Virgina Jock, seen by me on 08/06/19, HST on 08/25/19.    Please call and notify the patient that the recent home sleep test showed obstructive sleep apnea in the severe range. While I recommend treatment for this in the form CPAP, her insurance has not approved a sleep study in the lab for this. They will likely only approve a trial of autoPAP, which means, that we don't have to bring her in for a sleep study with CPAP, but will let her try an autoPAP machine at home, through a DME company (of her choice, or as per insurance requirement). The DME representative will educate her on how to use the machine, how to put the mask on, etc. I have placed an order in the chart. Please send referral, talk to patient, send report to referring MD. We will need a FU in sleep clinic for 10 weeks post-PAP set up, please arrange that with me or one of our NPs. Thanks,   Paula Age, MD, PhD [facility])

## 2019-09-02 NOTE — Telephone Encounter (Signed)
I called pt to discuss her sleep study results. No answer, VM is full, unable to leave a message.

## 2019-09-03 NOTE — Telephone Encounter (Signed)
I called pt. I advised pt that Dr. Rexene Alberts reviewed their sleep study results and found that pt has severe osa. Dr. Rexene Alberts recommends that pt start an auto pap at home. I reviewed PAP compliance expectations with the pt. Pt is agreeable to starting an auto-PAP. I advised pt that an order will be sent to a DME, [facility], and [facility] will call the pt within about one week after they file with the pt's insurance. [facility] will show the pt how to use the machine, fit for masks, and troubleshoot the auto-PAP if needed. A follow up appt was made for insurance purposes with Jinny Blossom, NP on 3/24/21at 3:00pm. Pt verbalized understanding to arrive 15 minutes early and bring their auto-PAP. A letter with all of this information in it will be mailed to the pt as a reminder. I verified with the pt that the address we have on file is correct. Pt verbalized understanding of results. Pt had no questions at this time but was encouraged to call back if questions arise. I have sent the order to [facility] and have received confirmation that they have received the order.

## 2019-10-30 ENCOUNTER — Other Ambulatory Visit: Payer: Self-pay

## 2019-10-30 ENCOUNTER — Ambulatory Visit (INDEPENDENT_AMBULATORY_CARE_PROVIDER_SITE_OTHER): Payer: BC Managed Care – PPO | Admitting: Cardiology

## 2019-10-30 ENCOUNTER — Encounter: Payer: Self-pay | Admitting: Cardiology

## 2019-10-30 VITALS — BP 116/54 | HR 55 | Temp 97.8°F | Ht 64.0 in | Wt 376.0 lb

## 2019-10-30 DIAGNOSIS — I480 Paroxysmal atrial fibrillation: Secondary | ICD-10-CM

## 2019-10-30 NOTE — Progress Notes (Signed)
Patient referred by Nickola Major, MD for palpitations  Subjective:   Paula Campos, female    DOB: March 08, 1966, 54 y.o.   MRN: [id number]   Chief Complaint  Patient presents with  . Palpitations  . Follow-up    3 month    HPI  54 y/o Caucasian female with hypertension, hyperlipidemia, type 2 DM, morbid obesity BMI>60, OSA, paroxysmal Afib.  Patient has had improvement in palpitation symptoms. Her weight loss program participation has been delayed due to the pandemic. She denies any chest pain. She is tolerating eliquis without any bleeding issues. She is compliant with OSA.   Current Outpatient Medications on File Prior to Visit  Medication Sig Dispense Refill  . albuterol (PROVENTIL HFA;VENTOLIN HFA) 108 (90 Base) MCG/ACT inhaler Inhale 1 puff into the lungs daily as needed for shortness of breath.    . ALPRAZolam (XANAX) 0.5 MG tablet Take 0.5 mg by mouth at bedtime as needed for anxiety.    Marland Kitchen apixaban (ELIQUIS) 5 MG TABS tablet Take 1 tablet (5 mg total) by mouth 2 (two) times daily. 60 tablet 2  . Ascorbic Acid (VITAMIN C) 1000 MG tablet Take 1,000 mg by mouth daily.    Marland Kitchen atorvastatin (LIPITOR) 40 MG tablet Take 40 mg by mouth daily.  1  . b complex vitamins capsule Take 1 capsule by mouth daily.    . Calcium Carbonate-Vitamin D (CALCIUM + D PO) Take 2 tablets by mouth daily.     Noelle Penner FIBER SUPPLEMENT PO Take 2 tablets by mouth daily.    . fluticasone (FLONASE) 50 MCG/ACT nasal spray Place 1 spray into both nostrils daily as needed for allergies.  0  . Magnesium 250 MG TABS Take 1 tablet by mouth daily.    . metoprolol tartrate (LOPRESSOR) 25 MG tablet Take 1 tablet (25 mg total) by mouth 2 (two) times daily. 60 tablet 3  . montelukast (SINGULAIR) 10 MG tablet Take 10 mg by mouth at bedtime.    . Multiple Vitamin (MULTIVITAMIN) tablet Take 1 tablet by mouth daily.    Marland Kitchen omeprazole (PRILOSEC) 40 MG capsule Take 40 mg by mouth daily.    . pioglitazone (ACTOS) 15 MG tablet  Take 15 mg by mouth daily.    . Probiotic Product (PROBIOTIC PO) Take by mouth.    . ramipril (ALTACE) 10 MG capsule Take 10 mg by mouth daily.    Marland Kitchen VITAMIN D PO Take by mouth.     No current facility-administered medications on file prior to visit.    Cardiovascular studies:  Event monitor 07/09/2019 - 08/07/2019: Diagnostic time: 99%  Dominant rhythm: Sinus. HR 45-171 bpm. Avg HR 68 bpm. 1 auto detected episode of Afib w/RVR up to 193 bpm noted on 08/07/2019. 1 auto detected episode of 4 beat NSVT on 07/27/2019. No other arrhythmia noted. Symptoms reported: None.   Echocardiogram 07/09/2019: Left ventricle cavity is normal in size. Mild concentric hypertrophy of the left ventricle. Normal LV systolic function with EF 55%. Normal global wall motion. Normal diastolic filling pattern.  Left atrial cavity is mildly dilated. Mild (Grade I) mitral regurgitation. Mild tricuspid regurgitation. Estimated pulmonary artery systolic pressure is 23 mmHg.  Treadmill Exercise Stress Test 07/09/2019: The patient exercised for 3:01 minutes on Bruce protocol; achieved 4.66 METs at 87% of maximum predicted heart rate. The baseline blood pressure was 154/92 mmHg and increased to 177/75 mmHg at peak exercise. Exercise capacity severely reduced. Chest pain is not present. Exercise was terminated  due to fatigue/weakness. Resting EKG normal sinus rhythm, stress EKG negative for myocardial ischemia.  No significant arrhythmias.  EKG 06/19/2019: Sinus rhythm 73 bpm. Low voltage in precordial leads.  Poor R-wave progression.  Recent labs: 06/03/2019: H/H 13/39. MCV 93. Platelets 242.  Glucose 111. BUN/Cr 19/0.74. eGFR >90. Na/K 141/4.5.  Chol 159, TG 111, HDL 54, LDL 98. HbA1C 6.5%   Review of Systems  Cardiovascular: Negative for chest pain, dyspnea on exertion, leg swelling, palpitations and syncope.        Vitals:   10/30/19 1441  BP: (!) 116/54  Pulse: (!) 55  Temp: 97.8 F (36.6 C)    SpO2: 99%     Objective:   Physical Exam  Constitutional: She appears well-developed and well-nourished.  Obesity  Neck: No JVD present.  Cardiovascular: Normal rate, regular rhythm, normal heart sounds and intact distal pulses.  No murmur heard. Pulmonary/Chest: Effort normal and breath sounds normal. She has no wheezes. She has no rales.  Musculoskeletal:        General: No edema.  Nursing note and vitals reviewed.         Assessment & Recommendations:   54 y/o Caucasian female with hypertension, hyperlipidemia, type 2 DM, morbid obesity BMI>60, OSA, paroxysmal Afib.  Paroxysmal Afib: Currently in sinus rhythm. CHA2DS2-VASc score 3, annual stroke risk 3.2% Continue eliquis 5 mg bid. Conitnue metoprolol 25 mg bid. Encouraged regular exercise and weight loss.  F/u in 6 months  Estefano Victory Esther Hardy, MD [facility] [place] Pager: [phone] Office: [phone] If no answer Cell [phone]

## 2019-11-05 ENCOUNTER — Other Ambulatory Visit: Payer: Self-pay | Admitting: Cardiology

## 2019-11-05 DIAGNOSIS — I480 Paroxysmal atrial fibrillation: Secondary | ICD-10-CM

## 2019-12-08 ENCOUNTER — Encounter: Payer: Self-pay | Admitting: Adult Health

## 2019-12-09 DIAGNOSIS — G4733 Obstructive sleep apnea (adult) (pediatric): Secondary | ICD-10-CM | POA: Insufficient documentation

## 2019-12-10 ENCOUNTER — Encounter: Payer: Self-pay | Admitting: Adult Health

## 2019-12-10 ENCOUNTER — Ambulatory Visit: Payer: BC Managed Care – PPO | Admitting: Adult Health

## 2019-12-10 ENCOUNTER — Other Ambulatory Visit: Payer: Self-pay

## 2019-12-10 VITALS — BP 121/70 | HR 55 | Temp 97.8°F | Ht 63.0 in | Wt 379.0 lb

## 2019-12-10 DIAGNOSIS — G4733 Obstructive sleep apnea (adult) (pediatric): Secondary | ICD-10-CM

## 2019-12-10 DIAGNOSIS — Z9989 Dependence on other enabling machines and devices: Secondary | ICD-10-CM

## 2019-12-10 NOTE — Patient Instructions (Addendum)
Continue using CPAP nightly and greater than 4 hours each night °If your symptoms worsen or you develop new symptoms please let us know.  ° °

## 2019-12-10 NOTE — Progress Notes (Addendum)
PATIENT: Paula Campos DOB: [birth year]  REASON FOR VISIT: follow up HISTORY FROM: patient  HISTORY OF PRESENT ILLNESS: Today 12/10/19:  Paula Campos is a 54 year old female with a history of obstructive sleep apnea on CPAP.  Her download indicates that she use her machine nightly for compliance of 100%.  She use her machine greater than 4 hours each night.  On average she uses her machine 7 hours and 47 minutes.  Her residual AHI is 1.4 on 7 to 13 cm of water with EPR 1.  Leak in the 95th percentile is 12.8 L/min.  Reports that she cannot tell a difference since she started using the CPAP.  She states that she actually feels more fatigued.  HISTORY (copied from Dr. Guadelupe Sabin note) Paula Campos is a 54 year old right-handed woman with an underlying medical history of NSVT, anxiety, asthma, diabetes, hyperlipidemia, reflux disease, history of kidney stones, hypertension, thyroid nodule, and morbid obesity with a BMI of over 60, who reports snoring and nocturnal palpitations, some sleep disruption.  She reports that sometimes she has a hard time going to sleep or staying asleep as she has a difficult time shutting down.  She likes to read before she falls asleep.  Generally, she is in bed around 10 and typically asleep before 11.  She does not typically watch TV in her bedroom.  Rise time is between 6:40 AM and 7:30 AM.  She is currently working from home, she works for a small call center.  She is finishing up her 30-day Holter monitor this week.  She wakes up with allergy symptoms and cough and some reflux symptoms at night.  She is on a PPI.  She has 2 dogs in the household, her 24 year old son lives with her, she is divorced.  She has no other children.  She is in weight management through [employer].  She has had occasional morning headaches, generally gets up to use the bathroom once and there morning around 5 and then goes back to bed.  She reports that she snores at times but not always.  I reviewed your  telemedicine note from July 30, 2019.  Her Epworth sleepiness score is 2 out of 24, fatigue severity score is 22 out of 63.  She is a non-smoker and drinks caffeine and limitation, 1 cup/day, rare alcohol.   REVIEW OF SYSTEMS: Out of a complete 14 system review of symptoms, the patient complains only of the following symptoms, and all other reviewed systems are negative.  FSS 30 ESS 3  ALLERGIES: Allergies  Allergen Reactions  . Dulaglutide Nausea And Vomiting    Bloating, gas  . Sulfa Antibiotics Dermatitis  . Adhesive [Tape] Rash  . Amoxicillin Rash    Has patient had a PCN reaction causing immediate rash, facial/tongue/throat swelling, SOB or lightheadedness with hypotension: No Has patient had a PCN reaction causing severe rash involving mucus membranes or skin necrosis: No Has patient had a PCN reaction that required hospitalization No Has patient had a PCN reaction occurring within the last 10 years: No If all of the above answers are "NO", then may proceed with Cephalosporin use.    HOME MEDICATIONS: Outpatient Medications Prior to Visit  Medication Sig Dispense Refill  . albuterol (PROVENTIL HFA;VENTOLIN HFA) 108 (90 Base) MCG/ACT inhaler Inhale 1 puff into the lungs daily as needed for shortness of breath.    . ALPRAZolam (XANAX) 0.5 MG tablet Take 0.5 mg by mouth at bedtime as needed for anxiety.    Marland Kitchen  Ascorbic Acid (VITAMIN C) 1000 MG tablet Take 1,000 mg by mouth daily.    Marland Kitchen atorvastatin (LIPITOR) 40 MG tablet Take 40 mg by mouth daily.  1  . b complex vitamins capsule Take 1 capsule by mouth daily.    . Calcium Carbonate-Vitamin D (CALCIUM + D PO) Take 2 tablets by mouth daily.     . clotrimazole-betamethasone (LOTRISONE) cream Apply twice daily as needed to rash    . ELIQUIS 5 MG TABS tablet TAKE 1 TABLET(5 MG) BY MOUTH TWICE DAILY 180 tablet 3  . EQ FIBER SUPPLEMENT PO Take 2 tablets by mouth daily.    . fluticasone (FLONASE) 50 MCG/ACT nasal spray Place 1 spray  into both nostrils daily as needed for allergies.  0  . Magnesium 250 MG TABS Take 1 tablet by mouth daily.    . montelukast (SINGULAIR) 10 MG tablet Take 10 mg by mouth at bedtime.    . Multiple Vitamin (MULTIVITAMIN) tablet Take 1 tablet by mouth daily.    Marland Kitchen omeprazole (PRILOSEC) 40 MG capsule Take 40 mg by mouth daily.    . Probiotic Product (PROBIOTIC PO) Take by mouth.    . ramipril (ALTACE) 10 MG capsule Take 10 mg by mouth daily.    Marland Kitchen VITAMIN D PO Take by mouth.    . metoprolol tartrate (LOPRESSOR) 25 MG tablet Take 1 tablet (25 mg total) by mouth 2 (two) times daily. 60 tablet 3  . pioglitazone (ACTOS) 15 MG tablet Take 15 mg by mouth daily.     No facility-administered medications prior to visit.    PAST MEDICAL HISTORY: Past Medical History:  Diagnosis Date  . Anxiety   . Asthma    Broncitis-"Bronchial asthma"-only uses singulair-no Inhalers required.  . Atrial fibrillation (HCC)   . Complication of anesthesia    slow  for epidural to wear off after c section  . Diabetes mellitus without complication (HCC)    type 2  . Elevated cholesterol   . GERD (gastroesophageal reflux disease)   . History of hiatal hernia   . History of kidney stones   . Hypertension   . Pneumonia   . Thyroid nodule     PAST SURGICAL HISTORY: Past Surgical History:  Procedure Laterality Date  . Broken ankle Right 2008  . CESAREAN SECTION  24 1/2 yrs ago   x 1  . COLONOSCOPY WITH PROPOFOL N/A 12/07/2015   Procedure: COLONOSCOPY WITH PROPOFOL;  Surgeon: Charna Elizabeth, MD;  Location: [facility];  Service: Endoscopy;  Laterality: N/A;  . lithotrispy  2011 or 2012    FAMILY HISTORY: Family History  Problem Relation Age of Onset  . Cancer Maternal Grandmother        Abdominal  . Heart attack Maternal Grandfather   . Hyperlipidemia Mother   . Diabetes Mother   . Breast cancer Neg Hx     SOCIAL HISTORY: Social History   Socioeconomic History  . Marital status: Divorced    Spouse  name: Not on file  . Number of children: 1  . Years of education: Not on file  . Highest education level: Not on file  Occupational History  . Not on file  Tobacco Use  . Smoking status: Never Smoker  . Smokeless tobacco: Never Used  Substance and Sexual Activity  . Alcohol use: Yes    Alcohol/week: 0.0 standard drinks    Comment: Rare  . Drug use: No  . Sexual activity: Not Currently    Birth control/protection: Other-see  comments    Comment: Vasectomy-1st intercourse 64 yo-5 partners  Other Topics Concern  . Not on file  Social History Narrative  . Not on file   Social Determinants of Health   Financial Resource Strain:   . Difficulty of Paying Living Expenses:   Food Insecurity:   . Worried About Programme researcher, broadcasting/film/video in the Last Year:   . Barista in the Last Year:   Transportation Needs:   . Freight forwarder (Medical):   Marland Kitchen Lack of Transportation (Non-Medical):   Physical Activity:   . Days of Exercise per Week:   . Minutes of Exercise per Session:   Stress:   . Feeling of Stress :   Social Connections:   . Frequency of Communication with Friends and Family:   . Frequency of Social Gatherings with Friends and Family:   . Attends Religious Services:   . Active Member of Clubs or Organizations:   . Attends Banker Meetings:   Marland Kitchen Marital Status:   Intimate Partner Violence:   . Fear of Current or Ex-Partner:   . Emotionally Abused:   Marland Kitchen Physically Abused:   . Sexually Abused:       PHYSICAL EXAM  Vitals:   12/10/19 1451  BP: 121/70  Pulse: (!) 55  Temp: 97.8 F (36.6 C)  Weight: (!) 379 lb (171.9 kg)  Height: 5\' 3"  (1.6 m)   Body mass index is 67.14 kg/m.  Generalized: Well developed, in no acute distress  Chest: Lungs clear to auscultation bilaterally  Neurological examination  Mentation: Alert oriented to time, place, history taking. Follows all commands speech and language fluent Cranial nerve II-XII: Extraocular  movements were full, visual field were full on confrontational test Head turning and shoulder shrug  were normal and symmetric. Motor: The motor testing reveals 5 over 5 strength of all 4 extremities. Good symmetric motor tone is noted throughout.  Sensory: Sensory testing is intact to soft touch on all 4 extremities. No evidence of extinction is noted.  Gait and station: Gait is normal.    DIAGNOSTIC DATA (LABS, IMAGING, TESTING) - I reviewed patient records, labs, notes, testing and imaging myself where available.  Lab Results  Component Value Date   WBC 21.8 (H) 06/23/2017   HGB 13.1 06/23/2017   HCT 39.6 06/23/2017   MCV 92.1 06/23/2017   PLT 250 06/23/2017      Component Value Date/Time   NA 141 06/23/2017 1720   K 3.8 06/23/2017 1720   CL 104 06/23/2017 1720   CO2 26 06/23/2017 1720   GLUCOSE 142 (H) 06/23/2017 1720   BUN 15 06/23/2017 1720   CREATININE 0.83 06/23/2017 1720   CALCIUM 9.4 06/23/2017 1720   PROT 7.6 06/23/2017 1720   ALBUMIN 3.8 06/23/2017 1720   AST 20 06/23/2017 1720   ALT 19 06/23/2017 1720   ALKPHOS 69 06/23/2017 1720   BILITOT 1.0 06/23/2017 1720   GFRNONAA >60 06/23/2017 1720   GFRAA >60 06/23/2017 1720      ASSESSMENT AND PLAN 54 y.o. year old female  has a past medical history of Anxiety, Asthma, Atrial fibrillation (HCC), Complication of anesthesia, Diabetes mellitus without complication (HCC), Elevated cholesterol, GERD (gastroesophageal reflux disease), History of hiatal hernia, History of kidney stones, Hypertension, Pneumonia, and Thyroid nodule. here with:  1. OSA on CPAP  - CPAP compliance excellent - Good treatment of AHI  - Encourage patient to use CPAP nightly and > 4 hours each night -We  will follow-up in 3 months to see if fatigue has improved   I spent 25 minutes of face-to-face and non-face-to-face time with patient.  This included previsit chart review, lab review, study review, order entry, electronic health record  documentation, patient education.  Butch Penny, MSN, NP-C 12/10/2019, 2:59 PM [facility] [address], [address] [place], [place] [address] [phone]  I reviewed the above note and documentation by the Nurse Practitioner and agree with the history, exam, assessment and plan as outlined above. I was available for consultation. Huston Foley, MD, PhD [facility])

## 2020-03-16 ENCOUNTER — Telehealth (INDEPENDENT_AMBULATORY_CARE_PROVIDER_SITE_OTHER): Payer: BC Managed Care – PPO | Admitting: Adult Health

## 2020-03-16 DIAGNOSIS — G4733 Obstructive sleep apnea (adult) (pediatric): Secondary | ICD-10-CM | POA: Diagnosis not present

## 2020-03-16 DIAGNOSIS — Z9989 Dependence on other enabling machines and devices: Secondary | ICD-10-CM | POA: Diagnosis not present

## 2020-03-16 NOTE — Progress Notes (Addendum)
PATIENT: Paula Campos DOB: 21-Oct-1965  REASON FOR VISIT: follow up HISTORY FROM: patient  Virtual Visit via Video Note  I connected with Paula Campos on 03/16/20 at  2:00 PM EDT by a video enabled telemedicine application located remotely at [facility] and verified that I am speaking with the correct person using two identifiers who was located at their own home.   I discussed the limitations of evaluation and management by telemedicine and the availability of in person appointments. The patient expressed understanding and agreed to proceed.   PATIENT: Paula Campos DOB: 13-Nov-1965  REASON FOR VISIT: follow up HISTORY FROM: patient  HISTORY OF PRESENT ILLNESS: Today 03/16/20:  Paula Campos is a 54 year old female with a history of obstructive sleep apnea on CPAP.  Her download indicates that she use her machine nightly for compliance of 100%.  She use her machine greater than 4 hours each night.  On average she uses her machine 8 hours and 44 minutes.  Her residual AHI is 1.4 on 7 to 13 cm of water with EPR of 1.  Leak in the 95th percentile is 9.2 L/min.  She reports that she still has some fatigue but she feels that it is better.  She reports that she does have to adjust her mask some at night.  Reports that she drools and sometimes that makes the mask slide.  She returns today for an evaluation.  HISTORY  12/10/19:  Paula Campos is a 54 year old female with a history of obstructive sleep apnea on CPAP.  Her download indicates that she use her machine nightly for compliance of 100%.  She use her machine greater than 4 hours each night.  On average she uses her machine 7 hours and 47 minutes.  Her residual AHI is 1.4 on 7 to 13 cm of water with EPR 1.  Leak in the 95th percentile is 12.8 L/min.  Reports that she cannot tell a difference since she started using the CPAP.  She states that she actually feels more fatigued.  REVIEW OF SYSTEMS: Out of a complete 14 system  review of symptoms, the patient complains only of the following symptoms, and all other reviewed systems are negative.  See HPI  ALLERGIES: Allergies  Allergen Reactions  . Dulaglutide Nausea And Vomiting    Bloating, gas  . Sulfa Antibiotics Dermatitis  . Adhesive [Tape] Rash  . Amoxicillin Rash    Has patient had a PCN reaction causing immediate rash, facial/tongue/throat swelling, SOB or lightheadedness with hypotension: No Has patient had a PCN reaction causing severe rash involving mucus membranes or skin necrosis: No Has patient had a PCN reaction that required hospitalization No Has patient had a PCN reaction occurring within the last 10 years: No If all of the above answers are "NO", then may proceed with Cephalosporin use.    HOME MEDICATIONS: Outpatient Medications Prior to Visit  Medication Sig Dispense Refill  . albuterol (PROVENTIL HFA;VENTOLIN HFA) 108 (90 Base) MCG/ACT inhaler Inhale 1 puff into the lungs daily as needed for shortness of breath.    . ALPRAZolam (XANAX) 0.5 MG tablet Take 0.5 mg by mouth at bedtime as needed for anxiety.    . Ascorbic Acid (VITAMIN C) 1000 MG tablet Take 1,000 mg by mouth daily.    Marland Kitchen atorvastatin (LIPITOR) 40 MG tablet Take 40 mg by mouth daily.  1  . b complex vitamins capsule Take 1 capsule by mouth daily.    . Calcium  Carbonate-Vitamin D (CALCIUM + D PO) Take 2 tablets by mouth daily.     . clotrimazole-betamethasone (LOTRISONE) cream Apply twice daily as needed to rash    . ELIQUIS 5 MG TABS tablet TAKE 1 TABLET(5 MG) BY MOUTH TWICE DAILY 180 tablet 3  . EQ FIBER SUPPLEMENT PO Take 2 tablets by mouth daily.    . fluticasone (FLONASE) 50 MCG/ACT nasal spray Place 1 spray into both nostrils daily as needed for allergies.  0  . Magnesium 250 MG TABS Take 1 tablet by mouth daily.    . metoprolol tartrate (LOPRESSOR) 25 MG tablet Take 1 tablet (25 mg total) by mouth 2 (two) times daily. 60 tablet 3  . montelukast (SINGULAIR) 10 MG  tablet Take 10 mg by mouth at bedtime.    . Multiple Vitamin (MULTIVITAMIN) tablet Take 1 tablet by mouth daily.    Marland Kitchen. omeprazole (PRILOSEC) 40 MG capsule Take 40 mg by mouth daily.    . Probiotic Product (PROBIOTIC PO) Take by mouth.    . ramipril (ALTACE) 10 MG capsule Take 10 mg by mouth daily.    Marland Kitchen. VITAMIN D PO Take by mouth.     No facility-administered medications prior to visit.    PAST MEDICAL HISTORY: Past Medical History:  Diagnosis Date  . Anxiety   . Asthma    Broncitis-"Bronchial asthma"-only uses singulair-no Inhalers required.  . Atrial fibrillation (HCC)   . Complication of anesthesia    slow  for epidural to wear off after c section  . Diabetes mellitus without complication (HCC)    type 2  . Elevated cholesterol   . GERD (gastroesophageal reflux disease)   . History of hiatal hernia   . History of kidney stones   . Hypertension   . Pneumonia   . Thyroid nodule     PAST SURGICAL HISTORY: Past Surgical History:  Procedure Laterality Date  . Broken ankle Right 2008  . CESAREAN SECTION  24 1/2 yrs ago   x 1  . COLONOSCOPY WITH PROPOFOL N/A 12/07/2015   Procedure: COLONOSCOPY WITH PROPOFOL;  Surgeon: Charna ElizabethJyothi Mann, MD;  Location: [facility];  Service: Endoscopy;  Laterality: N/A;  . lithotrispy  2011 or 2012    FAMILY HISTORY: Family History  Problem Relation Age of Onset  . Cancer Maternal Grandmother        Abdominal  . Heart attack Maternal Grandfather   . Hyperlipidemia Mother   . Diabetes Mother   . Breast cancer Neg Hx     SOCIAL HISTORY: Social History   Socioeconomic History  . Marital status: Divorced    Spouse name: Not on file  . Number of children: 1  . Years of education: Not on file  . Highest education level: Not on file  Occupational History  . Not on file  Tobacco Use  . Smoking status: Never Smoker  . Smokeless tobacco: Never Used  Vaping Use  . Vaping Use: Never used  Substance and Sexual Activity  . Alcohol use: Yes      Alcohol/week: 0.0 standard drinks    Comment: Rare  . Drug use: No  . Sexual activity: Not Currently    Birth control/protection: Other-see comments    Comment: Vasectomy-1st intercourse 54 yo-5 partners  Other Topics Concern  . Not on file  Social History Narrative  . Not on file   Social Determinants of Health   Financial Resource Strain:   . Difficulty of Paying Living Expenses:   Food Insecurity:   .  Worried About Programme researcher, broadcasting/film/video in the Last Year:   . Barista in the Last Year:   Transportation Needs:   . Freight forwarder (Medical):   Marland Kitchen Lack of Transportation (Non-Medical):   Physical Activity:   . Days of Exercise per Week:   . Minutes of Exercise per Session:   Stress:   . Feeling of Stress :   Social Connections:   . Frequency of Communication with Friends and Family:   . Frequency of Social Gatherings with Friends and Family:   . Attends Religious Services:   . Active Member of Clubs or Organizations:   . Attends Banker Meetings:   Marland Kitchen Marital Status:   Intimate Partner Violence:   . Fear of Current or Ex-Partner:   . Emotionally Abused:   Marland Kitchen Physically Abused:   . Sexually Abused:       PHYSICAL EXAM Generalized: Well developed, in no acute distress   Neurological examination  Mentation: Alert oriented to time, place, history taking. Follows all commands speech and language fluent Cranial nerve II-XII:Extraocular movements were full. Facial symmetry noted. uvula tongue midline. Head turning and shoulder shrug  were normal and symmetric. Motor: Good strength throughout subjectively per patient Sensory: Sensory testing is intact to soft touch on all 4 extremities subjectively per patient Coordination: Cerebellar testing reveals good finger-nose-finger  Gait and station: Patient is able to stand from a seated position. gait is normal.  Reflexes: UTA  DIAGNOSTIC DATA (LABS, IMAGING, TESTING) - I reviewed patient records,  labs, notes, testing and imaging myself where available.  Lab Results  Component Value Date   WBC 21.8 (H) 06/23/2017   HGB 13.1 06/23/2017   HCT 39.6 06/23/2017   MCV 92.1 06/23/2017   PLT 250 06/23/2017      Component Value Date/Time   NA 141 06/23/2017 1720   K 3.8 06/23/2017 1720   CL 104 06/23/2017 1720   CO2 26 06/23/2017 1720   GLUCOSE 142 (H) 06/23/2017 1720   BUN 15 06/23/2017 1720   CREATININE 0.83 06/23/2017 1720   CALCIUM 9.4 06/23/2017 1720   PROT 7.6 06/23/2017 1720   ALBUMIN 3.8 06/23/2017 1720   AST 20 06/23/2017 1720   ALT 19 06/23/2017 1720   ALKPHOS 69 06/23/2017 1720   BILITOT 1.0 06/23/2017 1720   GFRNONAA >60 06/23/2017 1720   GFRAA >60 06/23/2017 1720   No results found for: CHOL, HDL, LDLCALC, LDLDIRECT, TRIG, CHOLHDL Lab Results  Component Value Date   HGBA1C  03/02/2007    5.5 (NOTE)   The ADA recommends the following therapeutic goals for glycemic   control related to Hgb A1C measurement:   Goal of Therapy:   < 7.0% Hgb A1C   Action Suggested:  > 8.0% Hgb A1C   Ref:  Diabetes Care, 22, Suppl. 1, 1999   No results found for: VITAMINB12 No results found for: TSH    ASSESSMENT AND PLAN 54 y.o. year old female  has a past medical history of Anxiety, Asthma, Atrial fibrillation (HCC), Complication of anesthesia, Diabetes mellitus without complication (HCC), Elevated cholesterol, GERD (gastroesophageal reflux disease), History of hiatal hernia, History of kidney stones, Hypertension, Pneumonia, and Thyroid nodule. here with:  OSA on CPAP  . CPAP compliance excellent . Residual AHI is good . Encouraged patient to continue using CPAP nightly and > 4 hours each night . F/U in 1 year or sooner if needed  I spent 20 minutes of face-to-face and non-face-to-face time  with patient.  This included previsit chart review, lab review, study review, order entry, electronic health record documentation, patient education.  Butch Penny, MSN, NP-C  03/16/2020, 2:00 PM [facility] [address] [place], [place] [address] [phone]  I reviewed the above note and documentation by the Nurse Practitioner and agree with the history, exam, assessment and plan as outlined above. I was available for consultation. Huston Foley, MD, PhD [facility])

## 2020-04-29 ENCOUNTER — Other Ambulatory Visit: Payer: Self-pay

## 2020-04-29 ENCOUNTER — Encounter: Payer: Self-pay | Admitting: Cardiology

## 2020-04-29 ENCOUNTER — Ambulatory Visit: Payer: BC Managed Care – PPO | Admitting: Cardiology

## 2020-04-29 VITALS — BP 147/82 | HR 59 | Resp 17 | Ht 63.0 in | Wt 388.2 lb

## 2020-04-29 DIAGNOSIS — I480 Paroxysmal atrial fibrillation: Secondary | ICD-10-CM

## 2020-04-29 DIAGNOSIS — I10 Essential (primary) hypertension: Secondary | ICD-10-CM

## 2020-04-29 DIAGNOSIS — E782 Mixed hyperlipidemia: Secondary | ICD-10-CM

## 2020-04-29 NOTE — Progress Notes (Signed)
Patient referred by Nickola Major, MD for palpitations  Subjective:   Paula Campos, female    DOB: 07-27-1966, 54 y.o.   MRN: [id number]    Chief Complaint  Patient presents with  . Atrial Fibrillation  . Follow-up    6 month    HPI  54 y/o Caucasian female with hypertension, hyperlipidemia, type 2 DM, morbid obesity BMI>60, OSA, paroxysmal Afib.  Patient has had no palpitation symptoms. Her weight loss program participation has been delayed due to the pandemic. She denies any chest pain. She is tolerating eliquis without any bleeding issues. She is compliant with OSA.  Blood pressure elevated today.  She does not check blood pressure regularly at home.  Previous blood pressure readings have all been normal.   Current Outpatient Medications on File Prior to Visit  Medication Sig Dispense Refill  . ALPRAZolam (XANAX) 0.5 MG tablet Take 0.5 mg by mouth at bedtime as needed for anxiety.    . Ascorbic Acid (VITAMIN C) 1000 MG tablet Take 1,000 mg by mouth daily.    Marland Kitchen atorvastatin (LIPITOR) 40 MG tablet Take 40 mg by mouth daily.  1  . b complex vitamins capsule Take 1 capsule by mouth daily.    . Calcium Carbonate-Vitamin D (CALCIUM + D PO) Take 2 tablets by mouth daily.     . clotrimazole-betamethasone (LOTRISONE) cream Apply twice daily as needed to rash    . ELIQUIS 5 MG TABS tablet TAKE 1 TABLET(5 MG) BY MOUTH TWICE DAILY 180 tablet 3  . EQ FIBER SUPPLEMENT PO Take 2 tablets by mouth daily.    . Magnesium 250 MG TABS Take 1 tablet by mouth daily.    . metoprolol tartrate (LOPRESSOR) 25 MG tablet Take 1 tablet (25 mg total) by mouth 2 (two) times daily. 60 tablet 3  . montelukast (SINGULAIR) 10 MG tablet Take 10 mg by mouth at bedtime.    . Multiple Vitamin (MULTIVITAMIN) tablet Take 1 tablet by mouth daily.    Marland Kitchen omeprazole (PRILOSEC) 40 MG capsule Take 40 mg by mouth daily.    . Probiotic Product (PROBIOTIC PO) Take by mouth.    . ramipril (ALTACE) 10 MG capsule Take 10  mg by mouth daily.    Marland Kitchen VITAMIN D PO Take by mouth.    Marland Kitchen albuterol (PROVENTIL HFA;VENTOLIN HFA) 108 (90 Base) MCG/ACT inhaler Inhale 1 puff into the lungs daily as needed for shortness of breath. (Patient not taking: Reported on 04/29/2020)    . fluticasone (FLONASE) 50 MCG/ACT nasal spray Place 1 spray into both nostrils daily as needed for allergies. (Patient not taking: Reported on 04/29/2020)  0   No current facility-administered medications on file prior to visit.    Cardiovascular studies:  EKG 04/29/2020: Sinus rhythm 57 bpm Incomplete LBBB Low voltage No change compared to previous EKG  Event monitor 07/09/2019 - 08/07/2019: Diagnostic time: 99%  Dominant rhythm: Sinus. HR 45-171 bpm. Avg HR 68 bpm. 1 auto detected episode of Afib w/RVR up to 193 bpm noted on 08/07/2019. 1 auto detected episode of 4 beat NSVT on 07/27/2019. No other arrhythmia noted. Symptoms reported: None.  Echocardiogram 07/09/2019: Left ventricle cavity is normal in size. Mild concentric hypertrophy of the left ventricle. Normal LV systolic function with EF 55%. Normal global wall motion. Normal diastolic filling pattern.  Left atrial cavity is mildly dilated. Mild (Grade I) mitral regurgitation. Mild tricuspid regurgitation. Estimated pulmonary artery systolic pressure is 23 mmHg.  Treadmill Exercise Stress Test  07/09/2019: The patient exercised for 3:01 minutes on Bruce protocol; achieved 4.66 METs at 87% of maximum predicted heart rate. The baseline blood pressure was 154/92 mmHg and increased to 177/75 mmHg at peak exercise. Exercise capacity severely reduced. Chest pain is not present. Exercise was terminated due to fatigue/weakness. Resting EKG normal sinus rhythm, stress EKG negative for myocardial ischemia.  No significant arrhythmias.   Recent labs: 12/04/2019: Glucose 105, BUN/Cr 23/0.81. EGFR 82. Na/K 139/4.2. Rest of the CMP normal H/H 12/37. MCV 95. Platelets 242 HbA1C 5.8% Chol 149, TG  101, HDL 49, LDL 92  06/03/2019: H/H 13/39. MCV 93. Platelets 242.  Glucose 111. BUN/Cr 19/0.74. eGFR >90. Na/K 141/4.5.  Chol 159, TG 111, HDL 54, LDL 98. HbA1C 6.5%   Review of Systems  Cardiovascular: Negative for chest pain, dyspnea on exertion, leg swelling, palpitations and syncope.        Vitals:   04/29/20 1556  BP: (!) 147/82  Pulse: (!) 59  Resp: 17  SpO2: 98%     Objective:   Physical Exam Vitals and nursing note reviewed.  Constitutional:      Appearance: She is well-developed.     Comments: Obesity  Neck:     Vascular: No JVD.  Cardiovascular:     Rate and Rhythm: Normal rate and regular rhythm.     Pulses: Intact distal pulses.     Heart sounds: Normal heart sounds. No murmur heard.   Pulmonary:     Effort: Pulmonary effort is normal.     Breath sounds: Normal breath sounds. No wheezing or rales.           Assessment & Recommendations:   54 y/o Caucasian female with hypertension, hyperlipidemia, type 2 DM, morbid obesity BMI>60, OSA, paroxysmal Afib.  Paroxysmal Afib: Currently in sinus rhythm. CHA2DS2-VASc score 3, annual stroke risk 3.2% Continue eliquis 5 mg bid. Conitnue metoprolol 25 mg bid, ramipril 10 mg. I encouraged the patient to keep a blood pressure log and take it to her PCP visit next week.  If blood pressure remains greater than 140/80 mmHg, will consider increasing ramipril to 20 mg daily. Encouraged regular exercise and weight loss.  F/u in 6 months  Paula Sherard Esther Hardy, MD [facility] Pager: [phone] Office: [phone] If no answer Cell [phone]

## 2020-06-28 ENCOUNTER — Other Ambulatory Visit: Payer: Self-pay | Admitting: Family Medicine

## 2020-06-28 DIAGNOSIS — Z1231 Encounter for screening mammogram for malignant neoplasm of breast: Secondary | ICD-10-CM

## 2020-07-30 ENCOUNTER — Other Ambulatory Visit: Payer: Self-pay | Admitting: Cardiology

## 2020-07-30 DIAGNOSIS — I4729 Other ventricular tachycardia: Secondary | ICD-10-CM

## 2020-07-30 DIAGNOSIS — R002 Palpitations: Secondary | ICD-10-CM

## 2020-07-30 DIAGNOSIS — I480 Paroxysmal atrial fibrillation: Secondary | ICD-10-CM

## 2020-08-11 ENCOUNTER — Other Ambulatory Visit: Payer: Self-pay

## 2020-08-11 ENCOUNTER — Ambulatory Visit
Admission: RE | Admit: 2020-08-11 | Discharge: 2020-08-11 | Disposition: A | Discharge status: Home / Self Care | Payer: BC Managed Care – PPO | Source: Ambulatory Visit | Attending: Family Medicine | Admitting: Family Medicine

## 2020-08-11 DIAGNOSIS — Z1231 Encounter for screening mammogram for malignant neoplasm of breast: Secondary | ICD-10-CM | POA: Diagnosis present

## 2020-11-01 NOTE — Progress Notes (Signed)
Patient referred by Nickola Major, MD for palpitations  Subjective:   Paula Campos, female    DOB: Jun 25, 1966, 55 y.o.   MRN: [id number]    Chief Complaint  Patient presents with  . Atrial Fibrillation    HPI  55 y/o Caucasian female with hypertension, hyperlipidemia, type 2 DM, morbid obesity BMI>60, OSA, paroxysmal Afib.  Patient had 2 episodes of A. fib in November and December 2021, as noted on her smart watch, lasting for 30 minutes each.  These episodes occurred early in the morning, were self-limiting.  Patient is trying to lose weight, uses CPAP regularly.  She denies chest pain, shortness of breath, leg edema, orthopnea, PND, TIA/syncope.   Current Outpatient Medications on File Prior to Visit  Medication Sig Dispense Refill  . albuterol (PROVENTIL HFA;VENTOLIN HFA) 108 (90 Base) MCG/ACT inhaler Inhale 1 puff into the lungs daily as needed for shortness of breath. (Patient not taking: Reported on 04/29/2020)    . ALPRAZolam (XANAX) 0.5 MG tablet Take 0.5 mg by mouth at bedtime as needed for anxiety.    . Ascorbic Acid (VITAMIN C) 1000 MG tablet Take 1,000 mg by mouth daily.    Marland Kitchen atorvastatin (LIPITOR) 40 MG tablet Take 40 mg by mouth daily.  1  . b complex vitamins capsule Take 1 capsule by mouth daily.    . Calcium Carbonate-Vitamin D (CALCIUM + D PO) Take 2 tablets by mouth daily.     . clotrimazole-betamethasone (LOTRISONE) cream Apply twice daily as needed to rash    . ELIQUIS 5 MG TABS tablet TAKE 1 TABLET(5 MG) BY MOUTH TWICE DAILY 180 tablet 3  . EQ FIBER SUPPLEMENT PO Take 2 tablets by mouth daily.    . fluticasone (FLONASE) 50 MCG/ACT nasal spray Place 1 spray into both nostrils daily as needed for allergies. (Patient not taking: Reported on 04/29/2020)  0  . Magnesium 250 MG TABS Take 1 tablet by mouth daily.    . metoprolol tartrate (LOPRESSOR) 25 MG tablet TAKE 1/2 TABLET(12.5 MG) BY MOUTH TWICE DAILY 30 tablet 2  . montelukast (SINGULAIR) 10 MG tablet Take  10 mg by mouth at bedtime.    . Multiple Vitamin (MULTIVITAMIN) tablet Take 1 tablet by mouth daily.    Marland Kitchen omeprazole (PRILOSEC) 40 MG capsule Take 40 mg by mouth daily.    . Probiotic Product (PROBIOTIC PO) Take by mouth.    . ramipril (ALTACE) 10 MG capsule Take 10 mg by mouth daily.    Marland Kitchen VITAMIN D PO Take by mouth.     No current facility-administered medications on file prior to visit.    Cardiovascular studies:  EKG 11/03/2020: Sinus rhythm 54 bpm  Low voltage in precordial leads Poor R-wave progression   Event monitor 07/09/2019 - 08/07/2019: Diagnostic time: 99%  Dominant rhythm: Sinus. HR 45-171 bpm. Avg HR 68 bpm. 1 auto detected episode of Afib w/RVR up to 193 bpm noted on 08/07/2019. 1 auto detected episode of 4 beat NSVT on 07/27/2019. No other arrhythmia noted. Symptoms reported: None.  Echocardiogram 07/09/2019: Left ventricle cavity is normal in size. Mild concentric hypertrophy of the left ventricle. Normal LV systolic function with EF 55%. Normal global wall motion. Normal diastolic filling pattern.  Left atrial cavity is mildly dilated. Mild (Grade I) mitral regurgitation. Mild tricuspid regurgitation. Estimated pulmonary artery systolic pressure is 23 mmHg.  Treadmill Exercise Stress Test 07/09/2019: The patient exercised for 3:01 minutes on Bruce protocol; achieved 4.66 METs at 87% of  maximum predicted heart rate. The baseline blood pressure was 154/92 mmHg and increased to 177/75 mmHg at peak exercise. Exercise capacity severely reduced. Chest pain is not present. Exercise was terminated due to fatigue/weakness. Resting EKG normal sinus rhythm, stress EKG negative for myocardial ischemia.  No significant arrhythmias.   Recent labs: 06/10/2020: Glucose 126, BUN/Cr 14/0.75. EGFR >90. Na/K 141/4.2. Rest of the CMP normal H/H 12/38. MCV 93. Platelets 250 HbA1C 6.4% TSH 1.2 normal  12/04/2019: Glucose 105, BUN/Cr 23/0.81. EGFR 82. Na/K 139/4.2. Rest of the  CMP normal H/H 12/37. MCV 95. Platelets 242 HbA1C 5.8% Chol 149, TG 101, HDL 49, LDL 92  Review of Systems  Cardiovascular: Negative for chest pain, dyspnea on exertion, leg swelling, palpitations and syncope.        Vitals:   11/03/20 0921  BP: (!) 148/87  Pulse: (!) 54  Temp: 98.2 F (36.8 C)  SpO2: 99%     Objective:   Physical Exam Vitals and nursing note reviewed.  Constitutional:      Appearance: She is well-developed.     Comments: Obesity  Neck:     Vascular: No JVD.  Cardiovascular:     Rate and Rhythm: Normal rate and regular rhythm.     Pulses: Intact distal pulses.     Heart sounds: Normal heart sounds. No murmur heard.   Pulmonary:     Effort: Pulmonary effort is normal.     Breath sounds: Normal breath sounds. No wheezing or rales.           Assessment & Recommendations:   55 y/o Caucasian female with hypertension, hyperlipidemia, type 2 DM, morbid obesity BMI>60, OSA, paroxysmal Afib.  Paroxysmal Afib: Currently in sinus rhythm. Patient has episodes of A. fib.  Reasonable to continue conservative management.   If her symptoms get more frequent and severe, could consider rhythm control therapy.   Nonetheless, it remains paramount to address risk factors, including OSA and obesity.  CHA2DS2-VASc score 3, annual stroke risk 3.2% Continue eliquis 5 mg bid. Conitnue metoprolol 25 mg bid, ramipril 10 mg.  Hypertension: Blood pressure elevated today.  Patient reports that it is usually always normal, including at her recent physician visits. Encourage home monitoring.  If blood pressure consistently stays >140/80 mmHg, would recommend adding amlodipine 5 mg.  F/u in 1 year  Nigel Mormon, MD [facility] Pager: [phone] Office: [phone] If no answer Cell [phone]

## 2020-11-03 ENCOUNTER — Ambulatory Visit: Payer: BC Managed Care – PPO | Admitting: Cardiology

## 2020-11-03 ENCOUNTER — Other Ambulatory Visit: Payer: Self-pay

## 2020-11-03 ENCOUNTER — Encounter: Payer: Self-pay | Admitting: Cardiology

## 2020-11-03 VITALS — BP 148/87 | HR 54 | Temp 98.2°F | Ht 63.0 in | Wt 388.0 lb

## 2020-11-03 DIAGNOSIS — E782 Mixed hyperlipidemia: Secondary | ICD-10-CM

## 2020-11-03 DIAGNOSIS — I480 Paroxysmal atrial fibrillation: Secondary | ICD-10-CM

## 2020-11-03 DIAGNOSIS — I10 Essential (primary) hypertension: Secondary | ICD-10-CM

## 2020-12-07 ENCOUNTER — Other Ambulatory Visit: Payer: Self-pay | Admitting: Cardiology

## 2020-12-07 DIAGNOSIS — I480 Paroxysmal atrial fibrillation: Secondary | ICD-10-CM

## 2021-03-15 ENCOUNTER — Telehealth: Payer: BC Managed Care – PPO | Admitting: Adult Health

## 2021-03-15 DIAGNOSIS — G4733 Obstructive sleep apnea (adult) (pediatric): Secondary | ICD-10-CM | POA: Diagnosis not present

## 2021-03-15 DIAGNOSIS — Z9989 Dependence on other enabling machines and devices: Secondary | ICD-10-CM | POA: Diagnosis not present

## 2021-03-15 NOTE — Progress Notes (Signed)
PATIENT: Paula Campos DOB: Jul 04, 1966  REASON FOR VISIT: follow up HISTORY FROM: patient PRIMARY NEUROLOGIST: Dr. Frances Furbish  Virtual Visit via Video Note  I connected with Paula Campos on 03/15/21 at  2:30 PM EDT by a video enabled telemedicine application located remotely at [facility] and verified that I am speaking with the correct person using two identifiers who was located at their own home.   I discussed the limitations of evaluation and management by telemedicine and the availability of in person appointments. The patient expressed understanding and agreed to proceed.   PATIENT: Paula Campos DOB: Oct 24, 1965  REASON FOR VISIT: follow up HISTORY FROM: patient  HISTORY OF PRESENT ILLNESS: Today 03/15/21:  Paula Campos is a 55 year old female with a history of obstructive sleep apnea on CPAP.  She returns today for an evaluation.  She reports that the CPAP continues to work well for her.  She denies any new issues.  Her report is attached below.    HISTORY 03/16/20:   Paula Campos is a 55 year old female with a history of obstructive sleep apnea on CPAP.  Her download indicates that she use her machine nightly for compliance of 100%.  She use her machine greater than 4 hours each night.  On average she uses her machine 8 hours and 44 minutes.  Her residual AHI is 1.4 on 7 to 13 cm of water with EPR of 1.  Leak in the 95th percentile is 9.2 L/min.  She reports that she still has some fatigue but she feels that it is better.  She reports that she does have to adjust her mask some at night.  Reports that she drools and sometimes that makes the mask slide.  She returns today for an evaluation.  REVIEW OF SYSTEMS: Out of a complete 14 system review of symptoms, the patient complains only of the following symptoms, and all other reviewed systems are negative.  See HPI  ALLERGIES: Allergies  Allergen Reactions   Dulaglutide Nausea And Vomiting    Bloating, gas    Sulfa Antibiotics Dermatitis and Other (See Comments)   Adhesive [Tape] Rash   Amoxicillin Rash    Has patient had a PCN reaction causing immediate rash, facial/tongue/throat swelling, SOB or lightheadedness with hypotension: No Has patient had a PCN reaction causing severe rash involving mucus membranes or skin necrosis: No Has patient had a PCN reaction that required hospitalization No Has patient had a PCN reaction occurring within the last 10 years: No If all of the above answers are "NO", then may proceed with Cephalosporin use.    HOME MEDICATIONS: Outpatient Medications Prior to Visit  Medication Sig Dispense Refill   albuterol (PROVENTIL HFA;VENTOLIN HFA) 108 (90 Base) MCG/ACT inhaler Inhale 1 puff into the lungs daily as needed for shortness of breath.     ALPRAZolam (XANAX) 0.5 MG tablet Take 0.5 mg by mouth at bedtime as needed for anxiety.     Ascorbic Acid (VITAMIN C) 1000 MG tablet Take 1,000 mg by mouth daily.     atorvastatin (LIPITOR) 40 MG tablet Take 40 mg by mouth daily.  1   b complex vitamins capsule Take 1 capsule by mouth daily.     Calcium Carbonate-Vitamin D (CALCIUM + D PO) Take 2 tablets by mouth daily.      clotrimazole-betamethasone (LOTRISONE) cream Apply twice daily as needed to rash     ELIQUIS 5 MG TABS tablet TAKE 1 TABLET(5 MG) BY MOUTH TWICE DAILY  180 tablet 3   EQ FIBER SUPPLEMENT PO Take 2 tablets by mouth daily.     fluticasone (FLONASE) 50 MCG/ACT nasal spray Place 1 spray into both nostrils daily as needed for allergies.  0   Magnesium 250 MG TABS Take 1 tablet by mouth daily.     metoprolol tartrate (LOPRESSOR) 25 MG tablet TAKE 1/2 TABLET(12.5 MG) BY MOUTH TWICE DAILY 30 tablet 2   montelukast (SINGULAIR) 10 MG tablet Take 10 mg by mouth at bedtime.     Multiple Vitamin (MULTIVITAMIN) tablet Take 1 tablet by mouth daily.     omeprazole (PRILOSEC) 40 MG capsule Take 40 mg by mouth daily.     Probiotic Product (PROBIOTIC PO) Take by mouth.      ramipril (ALTACE) 10 MG capsule Take 10 mg by mouth daily.     VITAMIN D PO Take by mouth.     No facility-administered medications prior to visit.    PAST MEDICAL HISTORY: Past Medical History:  Diagnosis Date   Anxiety    Asthma    Broncitis-"Bronchial asthma"-only uses singulair-no Inhalers required.   Atrial fibrillation (HCC)    Complication of anesthesia    slow  for epidural to wear off after c section   Diabetes mellitus without complication (HCC)    type 2   Elevated cholesterol    GERD (gastroesophageal reflux disease)    History of hiatal hernia    History of kidney stones    Hypertension    Pneumonia    Thyroid nodule     PAST SURGICAL HISTORY: Past Surgical History:  Procedure Laterality Date   Broken ankle Right 2008   CESAREAN SECTION  24 1/2 yrs ago   x 1   COLONOSCOPY WITH PROPOFOL N/A 12/07/2015   Procedure: COLONOSCOPY WITH PROPOFOL;  Surgeon: Charna Elizabeth, MD;  Location: [facility];  Service: Endoscopy;  Laterality: N/A;   lithotrispy  2011 or 2012    FAMILY HISTORY: Family History  Problem Relation Age of Onset   Cancer Maternal Grandmother        Abdominal   Heart attack Maternal Grandfather    Hyperlipidemia Mother    Diabetes Mother    Breast cancer Neg Hx     SOCIAL HISTORY: Social History   Socioeconomic History   Marital status: Divorced    Spouse name: Not on file   Number of children: 1   Years of education: Not on file   Highest education level: Not on file  Occupational History   Not on file  Tobacco Use   Smoking status: Never   Smokeless tobacco: Never  Vaping Use   Vaping Use: Never used  Substance and Sexual Activity   Alcohol use: Yes    Alcohol/week: 0.0 standard drinks    Comment: Rare   Drug use: No   Sexual activity: Not Currently    Birth control/protection: Other-see comments    Comment: Vasectomy-1st intercourse 37 yo-5 partners  Other Topics Concern   Not on file  Social History Narrative   Not on  file   Social Determinants of Health   Financial Resource Strain: Not on file  Food Insecurity: Not on file  Transportation Needs: Not on file  Physical Activity: Not on file  Stress: Not on file  Social Connections: Not on file  Intimate Partner Violence: Not on file      PHYSICAL EXAM Generalized: Well developed, in no acute distress   Neurological examination  Mentation: Alert oriented to time, place,  history taking. Follows all commands speech and language fluent Cranial nerve II-XII:Extraocular movements were full. Facial symmetry noted. uvula tongue midline. Head turning and shoulder shrug  were normal and symmetric. Motor: Good strength throughout subjectively per patient Sensory: Sensory testing is intact to soft touch on all 4 extremities subjectively per patient Coordination: Cerebellar testing reveals good finger-nose-finger  Gait and station: Patient is able to stand from a seated position. gait is normal.  Reflexes: UTA  DIAGNOSTIC DATA (LABS, IMAGING, TESTING) - I reviewed patient records, labs, notes, testing and imaging myself where available.  Lab Results  Component Value Date   WBC 21.8 (H) 06/23/2017   HGB 13.1 06/23/2017   HCT 39.6 06/23/2017   MCV 92.1 06/23/2017   PLT 250 06/23/2017      Component Value Date/Time   NA 141 06/23/2017 1720   K 3.8 06/23/2017 1720   CL 104 06/23/2017 1720   CO2 26 06/23/2017 1720   GLUCOSE 142 (H) 06/23/2017 1720   BUN 15 06/23/2017 1720   CREATININE 0.83 06/23/2017 1720   CALCIUM 9.4 06/23/2017 1720   PROT 7.6 06/23/2017 1720   ALBUMIN 3.8 06/23/2017 1720   AST 20 06/23/2017 1720   ALT 19 06/23/2017 1720   ALKPHOS 69 06/23/2017 1720   BILITOT 1.0 06/23/2017 1720   GFRNONAA >60 06/23/2017 1720   GFRAA >60 06/23/2017 1720   .    ASSESSMENT AND PLAN 55 y.o. year old female  has a past medical history of Anxiety, Asthma, Atrial fibrillation (HCC), Complication of anesthesia, Diabetes mellitus without  complication (HCC), Elevated cholesterol, GERD (gastroesophageal reflux disease), History of hiatal hernia, History of kidney stones, Hypertension, Pneumonia, and Thyroid nodule. here with:  OSA on CPAP  CPAP compliance excellent Residual AHI is good Encouraged patient to continue using CPAP nightly and > 4 hours each night F/U in 1 year or sooner if needed  Butch Penny, MSN, NP-C 03/15/2021, 2:34 PM [facility] [address], [address] [place], [place] [address] [phone]

## 2021-07-06 ENCOUNTER — Other Ambulatory Visit: Payer: Self-pay | Admitting: Family Medicine

## 2021-07-06 DIAGNOSIS — Z1231 Encounter for screening mammogram for malignant neoplasm of breast: Secondary | ICD-10-CM

## 2021-08-16 ENCOUNTER — Ambulatory Visit
Admission: RE | Admit: 2021-08-16 | Discharge: 2021-08-16 | Disposition: A | Discharge status: Home / Self Care | Payer: BC Managed Care – PPO | Source: Ambulatory Visit | Attending: Family Medicine | Admitting: Family Medicine

## 2021-08-16 ENCOUNTER — Other Ambulatory Visit: Payer: Self-pay

## 2021-08-16 DIAGNOSIS — Z1231 Encounter for screening mammogram for malignant neoplasm of breast: Secondary | ICD-10-CM | POA: Diagnosis present

## 2021-11-03 ENCOUNTER — Ambulatory Visit: Payer: BC Managed Care – PPO | Admitting: Cardiology

## 2021-11-03 ENCOUNTER — Other Ambulatory Visit: Payer: Self-pay

## 2021-11-03 VITALS — BP 129/78 | HR 72 | Temp 98.2°F | Ht 63.0 in | Wt 388.0 lb

## 2021-11-03 DIAGNOSIS — I480 Paroxysmal atrial fibrillation: Secondary | ICD-10-CM

## 2021-11-03 MED ORDER — APIXABAN 5 MG PO TABS: 5.0000 mg | ORAL_TABLET | Freq: Two times a day (BID) | ORAL | 3 refills | Status: DC

## 2021-11-03 NOTE — Progress Notes (Signed)
Patient referred by Gwenlyn Found, MD for palpitations  Subjective:   Paula Campos, female    DOB: [birth year], 56 y.o.   MRN: [id number]    Chief Complaint  Patient presents with   Atrial Fibrillation   Follow-up    HPI  56 y/o Caucasian female with hypertension, hyperlipidemia, type 2 DM, morbid obesity BMI>60, OSA, paroxysmal Afib.  Patient is doing well, has only occasional episodes of palpitations.  Blood pressures well controlled.  She is trying to lose weight, but has not had any significant improvement.  Physical activity is limited due to knee pain.     Current Outpatient Medications on File Prior to Visit  Medication Sig Dispense Refill   albuterol (PROVENTIL HFA;VENTOLIN HFA) 108 (90 Base) MCG/ACT inhaler Inhale 1 puff into the lungs daily as needed for shortness of breath.     ALPRAZolam (XANAX) 0.5 MG tablet Take 0.5 mg by mouth at bedtime as needed for anxiety.     Ascorbic Acid (VITAMIN C) 1000 MG tablet Take 1,000 mg by mouth daily.     atorvastatin (LIPITOR) 40 MG tablet Take 40 mg by mouth daily.  1   b complex vitamins capsule Take 1 capsule by mouth daily.     Calcium Carbonate-Vitamin D (CALCIUM + D PO) Take 2 tablets by mouth daily.      clotrimazole-betamethasone (LOTRISONE) cream Apply twice daily as needed to rash     ELIQUIS 5 MG TABS tablet TAKE 1 TABLET(5 MG) BY MOUTH TWICE DAILY 180 tablet 3   EQ FIBER SUPPLEMENT PO Take 2 tablets by mouth daily.     fluticasone (FLONASE) 50 MCG/ACT nasal spray Place 1 spray into both nostrils daily as needed for allergies.  0   Magnesium 250 MG TABS Take 1 tablet by mouth daily.     metoprolol tartrate (LOPRESSOR) 25 MG tablet TAKE 1/2 TABLET(12.5 MG) BY MOUTH TWICE DAILY 30 tablet 2   montelukast (SINGULAIR) 10 MG tablet Take 10 mg by mouth at bedtime.     Multiple Vitamin (MULTIVITAMIN) tablet Take 1 tablet by mouth daily.     omeprazole (PRILOSEC) 40 MG capsule Take 40 mg by mouth daily.     Probiotic  Product (PROBIOTIC PO) Take by mouth.     ramipril (ALTACE) 10 MG capsule Take 10 mg by mouth daily.     VITAMIN D PO Take by mouth.     No current facility-administered medications on file prior to visit.    Cardiovascular studies:  EKG 11/03/2021: Sinus rhythm 61 bpm  Low voltage precordial leads Poor R-wave progression   Event monitor 07/09/2019 - 08/07/2019: Diagnostic time: 99%  Dominant rhythm: Sinus. HR 45-171 bpm. Avg HR 68 bpm. 1 auto detected episode of Afib w/RVR up to 193 bpm noted on 08/07/2019. 1 auto detected episode of 4 beat NSVT on 07/27/2019. No other arrhythmia noted. Symptoms reported: None.   Echocardiogram 07/09/2019: Left ventricle cavity is normal in size. Mild concentric hypertrophy of the left ventricle. Normal LV systolic function with EF 55%. Normal global wall motion. Normal diastolic filling pattern.  Left atrial cavity is mildly dilated. Mild (Grade I) mitral regurgitation. Mild tricuspid regurgitation. Estimated pulmonary artery systolic pressure is 23 mmHg.  Treadmill Exercise Stress Test 07/09/2019: The patient exercised for 3:01 minutes on Bruce protocol; achieved 4.66 METs at 87% of maximum predicted heart rate. The baseline blood pressure was 154/92 mmHg and increased to 177/75 mmHg at peak exercise. Exercise capacity severely reduced. Chest pain  is not present. Exercise was terminated due to fatigue/weakness. Resting EKG normal sinus rhythm, stress EKG negative for myocardial ischemia.  No significant arrhythmias.   Recent labs: 06/10/2020: Glucose 126, BUN/Cr 14/0.75. EGFR >90. Na/K 141/4.2. Rest of the CMP normal H/H 12/38. MCV 93. Platelets 250 HbA1C 6.4% TSH 1.2 normal  12/04/2019: Glucose 105, BUN/Cr 23/0.81. EGFR 82. Na/K 139/4.2. Rest of the CMP normal H/H 12/37. MCV 95. Platelets 242 HbA1C 5.8% Chol 149, TG 101, HDL 49, LDL 92  Review of Systems  Cardiovascular:  Negative for chest pain, dyspnea on exertion, leg swelling,  palpitations and syncope.       Vitals:   11/03/21 1434  BP: 129/78  Pulse: 72  Temp: 98.2 F (36.8 C)  SpO2: 99%     Objective:   Physical Exam Vitals and nursing note reviewed.  Constitutional:      Appearance: She is well-developed.     Comments: Obesity  Neck:     Vascular: No JVD.  Cardiovascular:     Rate and Rhythm: Normal rate and regular rhythm.     Pulses: Intact distal pulses.     Heart sounds: Normal heart sounds. No murmur heard. Pulmonary:     Effort: Pulmonary effort is normal.     Breath sounds: Normal breath sounds. No wheezing or rales.          Assessment & Recommendations:   56 y/o Caucasian female with hypertension, hyperlipidemia, type 2 DM, morbid obesity BMI>60, OSA, paroxysmal Afib.  Paroxysmal Afib: Currently in sinus rhythm. Only occasional episodes of A-fib.   If her symptoms get more frequent and severe, could consider rhythm control therapy.   Nonetheless, it remains paramount to address risk factors, including OSA and obesity.  CHA2DS2-VASc score 3, annual stroke risk 3.2% Continue eliquis 5 mg bid. Conitnue metoprolol 25 mg bid, ramipril 10 mg.  Hypertension: Well-controlled  OSA: Recommend CPAP  Obesity: Strongly correlated with her A-fib. Encouraged to talk to PCP regarding weight loss medication, such as semaglutide.  F/u in 1 year  Nigel Mormon, MD [facility] Pager: [phone] Office: [phone] If no answer Cell [phone]

## 2021-11-05 ENCOUNTER — Encounter: Payer: Self-pay | Admitting: Cardiology

## 2022-03-14 ENCOUNTER — Telehealth: Payer: BC Managed Care – PPO | Admitting: Adult Health

## 2022-03-14 DIAGNOSIS — Z9989 Dependence on other enabling machines and devices: Secondary | ICD-10-CM

## 2022-03-14 DIAGNOSIS — G4733 Obstructive sleep apnea (adult) (pediatric): Secondary | ICD-10-CM | POA: Diagnosis not present

## 2022-06-21 DIAGNOSIS — I480 Paroxysmal atrial fibrillation: Secondary | ICD-10-CM | POA: Insufficient documentation

## 2022-06-21 DIAGNOSIS — M1711 Unilateral primary osteoarthritis, right knee: Secondary | ICD-10-CM | POA: Insufficient documentation

## 2022-06-21 DIAGNOSIS — E1169 Type 2 diabetes mellitus with other specified complication: Secondary | ICD-10-CM | POA: Insufficient documentation

## 2022-08-03 ENCOUNTER — Other Ambulatory Visit: Payer: Self-pay | Admitting: Family Medicine

## 2022-08-03 DIAGNOSIS — Z1231 Encounter for screening mammogram for malignant neoplasm of breast: Secondary | ICD-10-CM

## 2022-08-30 ENCOUNTER — Ambulatory Visit
Admission: RE | Admit: 2022-08-30 | Discharge: 2022-08-30 | Disposition: A | Discharge status: Home / Self Care | Payer: BC Managed Care – PPO | Source: Ambulatory Visit | Attending: Family Medicine | Admitting: Family Medicine

## 2022-08-30 DIAGNOSIS — Z1231 Encounter for screening mammogram for malignant neoplasm of breast: Secondary | ICD-10-CM | POA: Diagnosis not present

## 2022-11-03 ENCOUNTER — Ambulatory Visit: Payer: BC Managed Care – PPO | Admitting: Cardiology

## 2022-11-08 ENCOUNTER — Ambulatory Visit: Payer: BC Managed Care – PPO | Admitting: Cardiology

## 2022-11-08 ENCOUNTER — Encounter: Payer: Self-pay | Admitting: Cardiology

## 2022-11-08 VITALS — BP 135/76 | HR 60 | Resp 16 | Ht 63.0 in | Wt 354.0 lb

## 2022-11-08 DIAGNOSIS — I480 Paroxysmal atrial fibrillation: Secondary | ICD-10-CM

## 2022-11-08 MED ORDER — APIXABAN 5 MG PO TABS: 5.0000 mg | ORAL_TABLET | Freq: Two times a day (BID) | ORAL | 3 refills | Status: DC

## 2022-11-08 NOTE — Progress Notes (Signed)
Patient referred by Nickola Major, MD for palpitations  Subjective:   Paula Campos, female    DOB: November 23, 1965, 57 y.o.   MRN: [id number]    Chief Complaint  Patient presents with   Paroxysmal atrial fibrillation ([place])   Follow-up    1 year    HPI  57 y/o Caucasian female with hypertension, hyperlipidemia, type 2 DM, morbid obesity BMI>60, OSA, paroxysmal Afib.  Patient is doing well, denies chest pain, shortness of breath, palpitations, leg edema, orthopnea, PND, TIA/syncope.  She has been working on weight loss diligently.  She uses CPAP regularly. She wants to avoid weight loss medications or surgery as far as possible. She does report occasional noncompliance with Eliquis.    Current Outpatient Medications:    acetaminophen (TYLENOL) 500 MG tablet, Take 500 mg by mouth every 6 (six) hours as needed., Disp: , Rfl:    albuterol (PROVENTIL HFA;VENTOLIN HFA) 108 (90 Base) MCG/ACT inhaler, Inhale 1 puff into the lungs daily as needed for shortness of breath., Disp: , Rfl:    ALPRAZolam (XANAX) 0.5 MG tablet, Take 0.5 mg by mouth at bedtime as needed for anxiety., Disp: , Rfl:    apixaban (ELIQUIS) 5 MG TABS tablet, Take 1 tablet (5 mg total) by mouth 2 (two) times daily., Disp: 180 tablet, Rfl: 3   Ascorbic Acid (VITAMIN C) 1000 MG tablet, Take 1,000 mg by mouth daily., Disp: , Rfl:    atorvastatin (LIPITOR) 40 MG tablet, Take 40 mg by mouth daily., Disp: , Rfl: 1   b complex vitamins capsule, Take 1 capsule by mouth daily., Disp: , Rfl:    Calcium Carbonate-Vitamin D (CALCIUM + D PO), Take 2 tablets by mouth daily. , Disp: , Rfl:    clotrimazole-betamethasone (LOTRISONE) cream, Apply twice daily as needed to rash, Disp: , Rfl:    EQ FIBER SUPPLEMENT PO, Take 2 tablets by mouth daily., Disp: , Rfl:    fluticasone (FLONASE) 50 MCG/ACT nasal spray, Place 1 spray into both nostrils daily as needed for allergies., Disp: , Rfl: 0   Magnesium 250 MG TABS, Take 1 tablet by mouth  daily., Disp: , Rfl:    metoprolol tartrate (LOPRESSOR) 25 MG tablet, TAKE 1/2 TABLET(12.5 MG) BY MOUTH TWICE DAILY, Disp: 30 tablet, Rfl: 2   montelukast (SINGULAIR) 10 MG tablet, Take 10 mg by mouth at bedtime., Disp: , Rfl:    Multiple Vitamin (MULTIVITAMIN) tablet, Take 1 tablet by mouth daily., Disp: , Rfl:    omeprazole (PRILOSEC) 40 MG capsule, Take 40 mg by mouth daily., Disp: , Rfl:    Probiotic Product (PROBIOTIC PO), Take by mouth., Disp: , Rfl:    ramipril (ALTACE) 10 MG capsule, Take 10 mg by mouth daily., Disp: , Rfl:    VITAMIN D PO, Take by mouth., Disp: , Rfl:   Cardiovascular studies:  EKG 11/08/2022: Sinus rhythm 52 bpm  Low voltage in precordial leads Possible old anteroseptal infarct  Event monitor 07/09/2019 - 08/07/2019: Diagnostic time: 99%  Dominant rhythm: Sinus. HR 45-171 bpm. Avg HR 68 bpm. 1 auto detected episode of Afib w/RVR up to 193 bpm noted on 08/07/2019. 1 auto detected episode of 4 beat NSVT on 07/27/2019. No other arrhythmia noted. Symptoms reported: None.   Echocardiogram 07/09/2019: Left ventricle cavity is normal in size. Mild concentric hypertrophy of the left ventricle. Normal LV systolic function with EF 55%. Normal global wall motion. Normal diastolic filling pattern.  Left atrial cavity is mildly dilated. Mild (Grade  I) mitral regurgitation. Mild tricuspid regurgitation. Estimated pulmonary artery systolic pressure is 23 mmHg.  Treadmill Exercise Stress Test 07/09/2019: The patient exercised for 3:01 minutes on Bruce protocol; achieved 4.66 METs at 87% of maximum predicted heart rate. The baseline blood pressure was 154/92 mmHg and increased to 177/75 mmHg at peak exercise. Exercise capacity severely reduced. Chest pain is not present. Exercise was terminated due to fatigue/weakness. Resting EKG normal sinus rhythm, stress EKG negative for myocardial ischemia.  No significant arrhythmias.   Recent labs: 06/10/2020: Glucose 126, BUN/Cr  14/0.75. EGFR >90. Na/K 141/4.2. Rest of the CMP normal H/H 12/38. MCV 93. Platelets 250 HbA1C 6.4% TSH 1.2 normal  12/04/2019: Glucose 105, BUN/Cr 23/0.81. EGFR 82. Na/K 139/4.2. Rest of the CMP normal H/H 12/37. MCV 95. Platelets 242 HbA1C 5.8% Chol 149, TG 101, HDL 49, LDL 92  Review of Systems  Cardiovascular:  Negative for chest pain, dyspnea on exertion, leg swelling, palpitations and syncope.        Vitals:   11/08/22 1108  BP: 135/76  Pulse: 60  Resp: 16  SpO2: 92%     Objective:   Physical Exam Vitals and nursing note reviewed.  Constitutional:      Appearance: She is well-developed.     Comments: Obesity  Neck:     Vascular: No JVD.  Cardiovascular:     Rate and Rhythm: Normal rate and regular rhythm.     Pulses: Intact distal pulses.     Heart sounds: Normal heart sounds. No murmur heard. Pulmonary:     Effort: Pulmonary effort is normal.     Breath sounds: Normal breath sounds. No wheezing or rales.           Assessment & Recommendations:   57 y/o Caucasian female with hypertension, hyperlipidemia, type 2 DM, morbid obesity BMI>60, OSA, paroxysmal Afib.  Paroxysmal Afib: Currently in sinus rhythm. CHA2DS2-VASc score 3, annual stroke risk 3.2% Continue eliquis 5 mg bid. Emphasized importance. Conitnue metoprolol 25 mg bid, ramipril 10 mg. Conitnue weight loss efforts.   Hypertension: Well-controlled  OSA: Continue  CPAP  Obesity: Strongly correlated with her A-fib. Encouraged to talk to PCP regarding weight loss medication, such as semaglutide.  F/u in 1 year   Nigel Mormon, MD Pager: [phone] Office: [phone]

## 2023-03-01 ENCOUNTER — Encounter: Payer: Self-pay | Admitting: Adult Health

## 2023-03-12 ENCOUNTER — Encounter: Payer: Self-pay | Admitting: *Deleted

## 2023-03-13 ENCOUNTER — Telehealth (INDEPENDENT_AMBULATORY_CARE_PROVIDER_SITE_OTHER): Payer: PRIVATE HEALTH INSURANCE | Admitting: Adult Health

## 2023-03-13 DIAGNOSIS — G4733 Obstructive sleep apnea (adult) (pediatric): Secondary | ICD-10-CM

## 2023-05-11 ENCOUNTER — Encounter: Payer: Self-pay | Admitting: Adult Health

## 2023-05-16 NOTE — Telephone Encounter (Signed)
Cpap order and office notes, sleep study, faxed to [facility]. Received a receipt of confirmation. [phone]

## 2023-05-16 NOTE — Telephone Encounter (Signed)
Order form completed and is pending Megan's signature.

## 2023-06-22 ENCOUNTER — Other Ambulatory Visit: Payer: Self-pay

## 2023-06-22 DIAGNOSIS — I480 Paroxysmal atrial fibrillation: Secondary | ICD-10-CM

## 2023-06-22 NOTE — Telephone Encounter (Signed)
Pt last saw Dr Rosemary Holms 11/08/22, last labs 06/10/20 Creat 0.75, pt is overdue for labwork. Called pt's PCP, pt does not have labwork done within the past year at their office.  Attempted to call pt to schedule follow-up labwork, LMOM TCB.  Will await call back to get pt scheduled for labwork.

## 2023-06-22 NOTE — Telephone Encounter (Signed)
Requesting medication be sent to a different pharmacy. Please address

## 2023-06-25 ENCOUNTER — Other Ambulatory Visit: Payer: Self-pay

## 2023-06-25 DIAGNOSIS — I480 Paroxysmal atrial fibrillation: Secondary | ICD-10-CM

## 2023-06-27 ENCOUNTER — Telehealth: Payer: Self-pay | Admitting: Cardiology

## 2023-06-27 DIAGNOSIS — R635 Abnormal weight gain: Secondary | ICD-10-CM | POA: Insufficient documentation

## 2023-06-27 DIAGNOSIS — I480 Paroxysmal atrial fibrillation: Secondary | ICD-10-CM

## 2023-06-27 MED ORDER — APIXABAN 5 MG PO TABS: 5.0000 mg | ORAL_TABLET | Freq: Two times a day (BID) | ORAL | 0 refills | Status: DC

## 2023-06-27 NOTE — Telephone Encounter (Signed)
*  STAT* If patient is at the pharmacy, call can be transferred to refill team.   1. Which medications need to be refilled? (please list name of each medication and dose if known) apixaban (ELIQUIS) 5 MG TABS tablet   2. Which pharmacy/location (including street and city if local pharmacy) is medication to be sent to?  [facility] [place] [place] - [address]    3. Do they need a 30 day or 90 day supply? 90

## 2023-07-11 LAB — LAB REPORT - SCANNED
A1c: 6.5
Albumin, Urine POC: <7
Creatinine, POC: 139 mg/dL
EGFR: 74

## 2023-08-03 ENCOUNTER — Other Ambulatory Visit: Payer: Self-pay | Admitting: Family Medicine

## 2023-08-03 DIAGNOSIS — Z1231 Encounter for screening mammogram for malignant neoplasm of breast: Secondary | ICD-10-CM

## 2023-09-05 ENCOUNTER — Ambulatory Visit
Admission: RE | Admit: 2023-09-05 | Discharge: 2023-09-05 | Disposition: A | Discharge status: Home / Self Care | Payer: PRIVATE HEALTH INSURANCE | Source: Ambulatory Visit | Attending: Family Medicine | Admitting: Family Medicine

## 2023-09-05 DIAGNOSIS — Z1231 Encounter for screening mammogram for malignant neoplasm of breast: Secondary | ICD-10-CM | POA: Diagnosis present

## 2023-09-06 ENCOUNTER — Other Ambulatory Visit: Payer: Self-pay | Admitting: *Deleted

## 2023-09-06 DIAGNOSIS — I480 Paroxysmal atrial fibrillation: Secondary | ICD-10-CM

## 2023-09-06 MED ORDER — APIXABAN 5 MG PO TABS: 5.0000 mg | ORAL_TABLET | Freq: Two times a day (BID) | ORAL | 1 refills | Status: DC

## 2023-09-06 NOTE — Telephone Encounter (Signed)
Eliquis 5mg  refill request received. Patient is 57 years old, weight-160.6kg, Crea- 0.90 on 07/11/23 via scanned labs, Diagnosis-Afib, and last seen by Dr. Rosemary Holms on 11/08/22. Dose is appropriate based on dosing criteria. Will send in refill to requested pharmacy.

## 2023-11-07 ENCOUNTER — Ambulatory Visit: Payer: PRIVATE HEALTH INSURANCE | Admitting: Cardiology

## 2023-11-20 ENCOUNTER — Other Ambulatory Visit: Payer: Self-pay | Admitting: Cardiology

## 2023-11-20 DIAGNOSIS — I480 Paroxysmal atrial fibrillation: Secondary | ICD-10-CM

## 2023-11-20 NOTE — Telephone Encounter (Signed)
 Prescription refill request for Eliquis received. Indication: PAF Last office visit: 11/08/22  Ottie Glazier MD  (Appt 12/11/23) Scr: 0.90 on 07/11/23  Epic Age: 58 Weight: 160.6kg  Based on above findings Eliquis 5mg  twice daily is the appropriate dose.  Refill approved.

## 2023-12-11 ENCOUNTER — Ambulatory Visit: Payer: PRIVATE HEALTH INSURANCE | Attending: Cardiology | Admitting: Cardiology

## 2023-12-11 ENCOUNTER — Encounter: Payer: Self-pay | Admitting: Cardiology

## 2023-12-11 VITALS — BP 130/80 | HR 51 | Ht 63.0 in | Wt 356.8 lb

## 2023-12-11 DIAGNOSIS — I480 Paroxysmal atrial fibrillation: Secondary | ICD-10-CM

## 2023-12-11 DIAGNOSIS — I4729 Other ventricular tachycardia: Secondary | ICD-10-CM

## 2023-12-11 DIAGNOSIS — R002 Palpitations: Secondary | ICD-10-CM

## 2023-12-11 MED ORDER — METOPROLOL TARTRATE 25 MG PO TABS: 25.0000 mg | ORAL_TABLET | Freq: Two times a day (BID) | ORAL | 3 refills | Status: DC

## 2023-12-11 MED ORDER — METOPROLOL TARTRATE 25 MG PO TABS: 25.0000 mg | ORAL_TABLET | Freq: Two times a day (BID) | ORAL | 1 refills | Status: DC

## 2023-12-11 NOTE — Patient Instructions (Signed)
 Medication Instructions:  Refilled Metoprolol (PCP to take over prescription)   *If you need a refill on your cardiac medications before your next appointment, please call your pharmacy*  Follow-Up: At [facility], you and your health needs are our priority.  As part of our continuing mission to provide you with exceptional heart care, we have created designated Provider Care Teams.  These Care Teams include your primary Cardiologist (physician) and Advanced Practice Providers (APPs -  Physician Assistants and Nurse Practitioners) who all work together to provide you with the care you need, when you need it.  We recommend signing up for the patient portal called "MyChart".  Sign up information is provided on this After Visit Summary.  MyChart is used to connect with patients for Virtual Visits (Telemedicine).  Patients are able to view lab/test results, encounter notes, upcoming appointments, etc.  Non-urgent messages can be sent to your provider as well.   To learn more about what you can do with MyChart, go to [email or web address].    Your next appointment:   As needed   Provider:   Elder Negus, MD     Other Instructions   1st Floor: - Lobby - Registration  - Pharmacy  - Lab - Cafe  2nd Floor: - PV Lab - Diagnostic Testing (echo, CT, nuclear med)  3rd Floor: - Vacant  4th Floor: - TCTS (cardiothoracic surgery) - AFib Clinic - Structural Heart Clinic - Vascular Surgery  - Vascular Ultrasound  5th Floor: - [facility] (general and EP) - Clinical Pharmacy for coumadin, hypertension, lipid, weight-loss medications, and med management appointments    Valet parking services will be available as well.

## 2023-12-11 NOTE — Addendum Note (Signed)
 Addended by: Neita Goodnight B on: 12/11/2023 04:19 PM   Modules accepted: Orders

## 2023-12-11 NOTE — Progress Notes (Signed)
 Cardiology Office Note:  .   Date:  12/11/2023  ID:  Paula Campos, DOB 11/30/1965, MRN [id number] PCP: Paula Found, MD  [facility] Providers Cardiologist:  Paula Mainland, MD PCP: Paula Found, MD  Chief Complaint  Patient presents with   Atrial Fibrillation     Paula Campos is a 58 y.o. female with hypertension, hyperlipidemia, type 2 DM, morbid obesity BMI>60, OSA, paroxysmal Afib.   Patient is doing well, denies complaints of chest pain, palpitations, low.  A-fib was noted on smart watch.  She has underlying history of asthma, with no change in her mild shortness of breath symptoms.  She is compliant with her medical therapy.  She denies any bleeding issues.  She has tried Trulicity for weight loss before and had allergic reaction and does not want to try any other medications at this time.     Vitals:   12/11/23 1012  BP: 130/80  Pulse: (!) 51  SpO2: 98%      Review of Systems  Cardiovascular:  Negative for chest pain, dyspnea on exertion, leg swelling, palpitations and syncope.        Studies Reviewed: Marland Kitchen    EKG 12/11/2023: Sinus bradycardia 51 bpm Low voltage QRS When compared with ECG of 02-Mar-2007 19:55, QT has shortened    Independently interpreted 06/2023: eGFR 74  Risk Assessment/Calculations:    CHA2DS2-VASc Score = 3  This indicates a 3.2% annual risk of stroke. The patient's score is based upon: CHF History: 0 HTN History: 1 Diabetes History: 1 Stroke History: 0 Vascular Disease History: 0 Age Score: 0 Gender Score: 1     Physical Exam Vitals and nursing note reviewed.  Constitutional:      General: She is not in acute distress.    Appearance: She is obese.  Neck:     Vascular: No JVD.  Cardiovascular:     Rate and Rhythm: Normal rate and regular rhythm.     Heart sounds: Normal heart sounds. No murmur heard. Pulmonary:     Effort: Pulmonary effort is normal.     Breath sounds: Normal breath sounds. No  wheezing or rales.  Musculoskeletal:     Right lower leg: No edema.     Left lower leg: No edema.      VISIT DIAGNOSES:   ICD-10-CM   1. Paroxysmal atrial fibrillation (HCC)  I48.0 EKG 12-Lead    metoprolol tartrate (LOPRESSOR) 25 MG tablet    2. Palpitations  R00.2 metoprolol tartrate (LOPRESSOR) 25 MG tablet    3. NSVT (nonsustained ventricular tachycardia) (HCC)  I47.29 metoprolol tartrate (LOPRESSOR) 25 MG tablet     Assessment & Plan:  Paula Campos is a 58 y.o. female with hypertension, hyperlipidemia, type 2 DM, morbid obesity BMI>60, OSA, paroxysmal Afib.   Paroxysmal Afib: Currently in sinus rhythm. CHA2DS2-VASc score 3, annual stroke risk 3.2% Continue eliquis 5 mg bid. Emphasized importance. Conitnue metoprolol 25 mg bid, ramipril 10 mg. Conitnue weight loss efforts.  Given a prior allergic reaction to Trulicity, she is not interested in any other weight loss medications at this time.   Hypertension: Well-controlled   OSA: Continue  CPAP   Obesity: Strongly correlated with her A-fib. Encouraged to talk to PCP regarding weight loss medication, such as semaglutide.   With no recurrence of A-fib and no ongoing cardiac issues, I will defer further medication refill to her PCP Dr. Rene Campos.  I will see her as needed.   Meds ordered this encounter  Medications   metoprolol tartrate (LOPRESSOR) 25 MG tablet    Sig: Take 1 tablet (25 mg total) by mouth 2 (two) times daily.    Dispense:  90 tablet    Refill:  3      Signed, Paula Negus, MD

## 2024-02-17 NOTE — Progress Notes (Unsigned)
 PATIENT: Paula Campos DOB: 04-Mar-1966  REASON FOR VISIT: follow up HISTORY FROM: patient PRIMARY NEUROLOGIST: Dr. Omar Bibber  Virtual Visit via Video Note  I connected with Rosy L Daye on 02/19/24 at  2:15 PM EDT by a video enabled telemedicine application located remotely at [facility] and verified that I am speaking with the correct person using two identifiers who was located at their own home.   I discussed the limitations of evaluation and management by telemedicine and the availability of in person appointments. The patient expressed understanding and agreed to proceed.   PATIENT: Paula Campos DOB: [birth year]  REASON FOR VISIT: follow up HISTORY FROM: patient  HISTORY OF PRESENT ILLNESS: Today 02/19/24  ERMAGENE Campos is a 58 y.o. female with a history of OSA on CPAP. Returns today for follow-up.  Reports that CPAP is working well for her.  Denies any new issues.  Her download is below    03/13/23: Paula Campos is a 58 y.o. female with a history of obstructive sleep apnea on CPAP. Returns today for follow-up.  She reports that her CPAP is working well.  She denies any new issues.  Her download is below     03/14/22: Paula Campos is a 58 year old female with a history of obstructive sleep apnea on CPAP.  She returns today for follow-up.  She states that CPAP continues to work well for her.  Some nights she struggles with the tubing but overall she does not feel that it is affecting her sleep in a negative way.  She returns today for an evaluation.    Paula Campos is a 58 year old female with a history of obstructive sleep apnea on CPAP.  She returns today for an evaluation.  She reports that the CPAP continues to work well for her.  She denies any new issues.  Her report is attached below.    HISTORY 03/16/20:   Paula Campos is a 58 year old female with a history of obstructive sleep apnea on CPAP.  Her download indicates that she use her machine nightly for  compliance of 100%.  She use her machine greater than 4 hours each night.  On average she uses her machine 8 hours and 44 minutes.  Her residual AHI is 1.4 on 7 to 13 cm of water with EPR of 1.  Leak in the 95th percentile is 9.2 L/min.  She reports that she still has some fatigue but she feels that it is better.  She reports that she does have to adjust her mask some at night.  Reports that she drools and sometimes that makes the mask slide.  She returns today for an evaluation.  REVIEW OF SYSTEMS: Out of a complete 14 system review of symptoms, the patient complains only of the following symptoms, and all other reviewed systems are negative.  See HPI  ALLERGIES: Allergies  Allergen Reactions   Dulaglutide Nausea And Vomiting    Bloating, gas   Sulfa Antibiotics Dermatitis and Other (See Comments)   Adhesive [Tape] Rash   Amoxicillin Rash    Has patient had a PCN reaction causing immediate rash, facial/tongue/throat swelling, SOB or lightheadedness with hypotension: No Has patient had a PCN reaction causing severe rash involving mucus membranes or skin necrosis: No Has patient had a PCN reaction that required hospitalization No Has patient had a PCN reaction occurring within the last 10 years: No If all of the above answers are "NO", then may proceed with Cephalosporin use.  HOME MEDICATIONS: Outpatient Medications Prior to Visit  Medication Sig Dispense Refill   acetaminophen (TYLENOL) 500 MG tablet Take 500 mg by mouth every 6 (six) hours as needed.     albuterol (PROVENTIL HFA;VENTOLIN HFA) 108 (90 Base) MCG/ACT inhaler Inhale 1 puff into the lungs daily as needed for shortness of breath.     ALPRAZolam (XANAX) 0.5 MG tablet Take 0.5 mg by mouth at bedtime as needed for anxiety.     apixaban  (ELIQUIS ) 5 MG TABS tablet TAKE 1 TABLET(5 MG) BY MOUTH TWICE DAILY 180 tablet 0   Ascorbic Acid (VITAMIN C) 1000 MG tablet Take 1,000 mg by mouth daily.     atorvastatin (LIPITOR) 40 MG tablet  Take 40 mg by mouth daily.  1   b complex vitamins capsule Take 1 capsule by mouth daily.     Calcium Carbonate-Vitamin D (CALCIUM + D PO) Take 2 tablets by mouth daily.      clotrimazole-betamethasone (LOTRISONE) cream Apply twice daily as needed to rash     Coenzyme Q10 50 MG CAPS Take by mouth.     EQ FIBER SUPPLEMENT PO Take 2 tablets by mouth daily.     fluticasone (FLONASE) 50 MCG/ACT nasal spray Place 1 spray into both nostrils daily as needed for allergies.  0   GLUCOSAMINE SULFATE PO      Magnesium 250 MG TABS Take 1 tablet by mouth daily.     metoprolol  tartrate (LOPRESSOR ) 25 MG tablet Take 1 tablet (25 mg total) by mouth 2 (two) times daily. 180 tablet 1   montelukast (SINGULAIR) 10 MG tablet Take 10 mg by mouth at bedtime.     Multiple Vitamin (MULTIVITAMIN) tablet Take 1 tablet by mouth daily.     omeprazole (PRILOSEC) 40 MG capsule Take 40 mg by mouth daily.     Probiotic Product (PROBIOTIC PO) Take by mouth.     ramipril (ALTACE) 10 MG capsule Take 10 mg by mouth daily.     VITAMIN D PO Take by mouth.     No facility-administered medications prior to visit.    PAST MEDICAL HISTORY: Past Medical History:  Diagnosis Date   Anxiety    Asthma    Broncitis-"Bronchial asthma"-only uses singulair-no Inhalers required.   Atrial fibrillation (HCC)    Complication of anesthesia    slow  for epidural to wear off after c section   Diabetes mellitus without complication (HCC)    type 2   Elevated cholesterol    GERD (gastroesophageal reflux disease)    History of hiatal hernia    History of kidney stones    Hypertension    Pneumonia    Thyroid  nodule     PAST SURGICAL HISTORY: Past Surgical History:  Procedure Laterality Date   Broken ankle Right 2008   CESAREAN SECTION  24 1/2 yrs ago   x 1   COLONOSCOPY WITH PROPOFOL  N/A 12/07/2015   Procedure: COLONOSCOPY WITH PROPOFOL ;  Surgeon: Tami Falcon, MD;  Location: [facility];  Service: Endoscopy;  Laterality: N/A;    lithotrispy  2011 or 2012    FAMILY HISTORY: Family History  Problem Relation Age of Onset   Cancer Maternal Grandmother        Abdominal   Heart attack Maternal Grandfather    Hyperlipidemia Mother    Diabetes Mother    Breast cancer Neg Hx     SOCIAL HISTORY: Social History   Socioeconomic History   Marital status: Divorced    Spouse name: Not  on file   Number of children: 1   Years of education: Not on file   Highest education level: Not on file  Occupational History   Not on file  Tobacco Use   Smoking status: Never   Smokeless tobacco: Never  Vaping Use   Vaping status: Never Used  Substance and Sexual Activity   Alcohol use: Yes    Alcohol/week: 0.0 standard drinks of alcohol    Comment: Rare   Drug use: No   Sexual activity: Not Currently    Birth control/protection: Other-see comments    Comment: Vasectomy-1st intercourse 74 yo-5 partners  Other Topics Concern   Not on file  Social History Narrative   Not on file   Social Drivers of Health   Financial Resource Strain: Not on file  Food Insecurity: Low Risk  (01/09/2024)   Received from [facility]   Hunger Vital Sign    Worried About Running Out of Food in the Last Year: Never true    Ran Out of Food in the Last Year: Never true  Transportation Needs: No Transportation Needs (01/09/2024)   Received from [facility]    In the past 12 months, has lack of reliable transportation kept you from medical appointments, meetings, work or from getting things needed for daily living? : No  Physical Activity: Not on file  Stress: Not on file  Social Connections: Not on file  Intimate Partner Violence: Not on file      PHYSICAL EXAM Generalized: Well developed, in no acute distress   Neurological examination  Mentation: Alert oriented to time, place, history taking. Follows all commands speech and language fluent Cranial nerve II-XII: Facial symmetry noted.  DIAGNOSTIC DATA (LABS,  IMAGING, TESTING) - I reviewed patient records, labs, notes, testing and imaging myself where available.  Lab Results  Component Value Date   WBC 21.8 (H) 06/23/2017   HGB 13.1 06/23/2017   HCT 39.6 06/23/2017   MCV 92.1 06/23/2017   PLT 250 06/23/2017      Component Value Date/Time   NA 141 06/23/2017 1720   K 3.8 06/23/2017 1720   CL 104 06/23/2017 1720   CO2 26 06/23/2017 1720   GLUCOSE 142 (H) 06/23/2017 1720   BUN 15 06/23/2017 1720   CREATININE 0.83 06/23/2017 1720   CALCIUM 9.4 06/23/2017 1720   PROT 7.6 06/23/2017 1720   ALBUMIN 3.8 06/23/2017 1720   AST 20 06/23/2017 1720   ALT 19 06/23/2017 1720   ALKPHOS 69 06/23/2017 1720   BILITOT 1.0 06/23/2017 1720   GFRNONAA >60 06/23/2017 1720   GFRAA >60 06/23/2017 1720   .    ASSESSMENT AND PLAN 58 y.o. year old female  has a past medical history of Anxiety, Asthma, Atrial fibrillation (HCC), Complication of anesthesia, Diabetes mellitus without complication (HCC), Elevated cholesterol, GERD (gastroesophageal reflux disease), History of hiatal hernia, History of kidney stones, Hypertension, Pneumonia, and Thyroid  nodule. here with:  OSA on CPAP  CPAP compliance excellent Residual AHI is good Encouraged patient to continue using CPAP nightly and > 4 hours each night F/U in 1 year or sooner if needed  Clem Currier, MSN, NP-C 02/21/2024, 8:07 AM [facility] [address] [place], [place] [address] [phone]

## 2024-02-18 ENCOUNTER — Encounter: Payer: Self-pay | Admitting: *Deleted

## 2024-02-19 ENCOUNTER — Telehealth (INDEPENDENT_AMBULATORY_CARE_PROVIDER_SITE_OTHER): Payer: PRIVATE HEALTH INSURANCE | Admitting: Adult Health

## 2024-02-19 DIAGNOSIS — G4733 Obstructive sleep apnea (adult) (pediatric): Secondary | ICD-10-CM

## 2024-03-11 ENCOUNTER — Telehealth: Payer: BC Managed Care – PPO | Admitting: Adult Health

## 2024-06-25 ENCOUNTER — Other Ambulatory Visit: Payer: Self-pay | Admitting: Cardiology

## 2024-06-25 DIAGNOSIS — R002 Palpitations: Secondary | ICD-10-CM

## 2024-06-25 DIAGNOSIS — I4729 Other ventricular tachycardia: Secondary | ICD-10-CM

## 2024-06-25 DIAGNOSIS — I480 Paroxysmal atrial fibrillation: Secondary | ICD-10-CM

## 2024-08-22 ENCOUNTER — Other Ambulatory Visit: Payer: Self-pay | Admitting: Family Medicine

## 2024-08-22 DIAGNOSIS — Z1231 Encounter for screening mammogram for malignant neoplasm of breast: Secondary | ICD-10-CM

## 2024-09-08 ENCOUNTER — Ambulatory Visit
Admission: RE | Admit: 2024-09-08 | Discharge: 2024-09-08 | Disposition: A | Discharge status: Home / Self Care | Payer: PRIVATE HEALTH INSURANCE | Source: Ambulatory Visit | Attending: Family Medicine | Admitting: Family Medicine

## 2024-09-08 DIAGNOSIS — Z1231 Encounter for screening mammogram for malignant neoplasm of breast: Secondary | ICD-10-CM | POA: Diagnosis present

## 2025-01-27 ENCOUNTER — Telehealth: Payer: PRIVATE HEALTH INSURANCE | Admitting: Adult Health
